# Patient Record
Sex: Female | Born: 1965
Health system: Southern US, Community
[De-identification: ages and names within clinical notes are randomized; demographics above are authoritative.]

## PROBLEM LIST (undated history)

## (undated) DIAGNOSIS — Z860101 Personal history of adenomatous and serrated colon polyps: Secondary | ICD-10-CM

## (undated) DIAGNOSIS — K219 Gastro-esophageal reflux disease without esophagitis: Secondary | ICD-10-CM

## (undated) DIAGNOSIS — G4730 Sleep apnea, unspecified: Secondary | ICD-10-CM

## (undated) DIAGNOSIS — F419 Anxiety disorder, unspecified: Secondary | ICD-10-CM

## (undated) DIAGNOSIS — F32A Depression, unspecified: Secondary | ICD-10-CM

## (undated) DIAGNOSIS — G4733 Obstructive sleep apnea (adult) (pediatric): Secondary | ICD-10-CM

## (undated) DIAGNOSIS — R911 Solitary pulmonary nodule: Secondary | ICD-10-CM

## (undated) DIAGNOSIS — R7989 Other specified abnormal findings of blood chemistry: Secondary | ICD-10-CM

## (undated) DIAGNOSIS — H269 Unspecified cataract: Secondary | ICD-10-CM

## (undated) DIAGNOSIS — Z7901 Long term (current) use of anticoagulants: Secondary | ICD-10-CM

## (undated) DIAGNOSIS — R000 Tachycardia, unspecified: Secondary | ICD-10-CM

## (undated) DIAGNOSIS — G4700 Insomnia, unspecified: Secondary | ICD-10-CM

## (undated) DIAGNOSIS — Z86010 Personal history of colonic polyps: Secondary | ICD-10-CM

## (undated) DIAGNOSIS — I10 Essential (primary) hypertension: Secondary | ICD-10-CM

## (undated) DIAGNOSIS — U071 COVID-19: Secondary | ICD-10-CM

## (undated) HISTORY — DX: Unspecified cataract: H26.9

## (undated) HISTORY — DX: Sleep apnea, unspecified: G47.30

## (undated) HISTORY — PX: OTHER SURGICAL HISTORY: SHX169

## (undated) HISTORY — PX: COLONOSCOPY: SHX174

## (undated) HISTORY — PX: POLYPECTOMY: SHX149

## (undated) HISTORY — DX: Anxiety disorder, unspecified: F41.9

## (undated) HISTORY — DX: Depression, unspecified: F32.A

## (undated) HISTORY — DX: Insomnia, unspecified: G47.00

## (undated) HISTORY — DX: Essential (primary) hypertension: I10

## (undated) HISTORY — DX: COVID-19: U07.1

## (undated) HISTORY — DX: Other specified abnormal findings of blood chemistry: R79.89

---

## 1998-10-16 ENCOUNTER — Other Ambulatory Visit: Admission: RE | Admit: 1998-10-16 | Discharge: 1998-10-16 | Payer: Self-pay | Admitting: Obstetrics and Gynecology

## 1999-12-10 ENCOUNTER — Other Ambulatory Visit: Admission: RE | Admit: 1999-12-10 | Discharge: 1999-12-10 | Payer: Self-pay | Admitting: Gynecology

## 2000-12-10 ENCOUNTER — Other Ambulatory Visit: Admission: RE | Admit: 2000-12-10 | Discharge: 2000-12-10 | Payer: Self-pay | Admitting: Gynecology

## 2001-12-31 ENCOUNTER — Other Ambulatory Visit: Admission: RE | Admit: 2001-12-31 | Discharge: 2001-12-31 | Payer: Self-pay | Admitting: Gynecology

## 2003-03-24 ENCOUNTER — Other Ambulatory Visit: Admission: RE | Admit: 2003-03-24 | Discharge: 2003-03-24 | Payer: Self-pay | Admitting: Gynecology

## 2006-08-20 ENCOUNTER — Encounter: Admission: RE | Admit: 2006-08-20 | Discharge: 2006-08-20 | Payer: Self-pay | Admitting: Obstetrics & Gynecology

## 2007-09-16 ENCOUNTER — Encounter: Admission: RE | Admit: 2007-09-16 | Discharge: 2007-09-16 | Payer: Self-pay | Admitting: Obstetrics & Gynecology

## 2008-09-18 ENCOUNTER — Encounter: Admission: RE | Admit: 2008-09-18 | Discharge: 2008-09-18 | Payer: Self-pay | Admitting: Obstetrics & Gynecology

## 2009-09-20 ENCOUNTER — Encounter: Admission: RE | Admit: 2009-09-20 | Discharge: 2009-09-20 | Payer: Self-pay | Admitting: Obstetrics & Gynecology

## 2010-09-23 ENCOUNTER — Encounter: Admission: RE | Admit: 2010-09-23 | Discharge: 2010-09-23 | Payer: Self-pay | Admitting: Obstetrics & Gynecology

## 2011-09-08 ENCOUNTER — Other Ambulatory Visit: Payer: Self-pay | Admitting: Obstetrics & Gynecology

## 2011-09-08 DIAGNOSIS — Z1231 Encounter for screening mammogram for malignant neoplasm of breast: Secondary | ICD-10-CM

## 2011-09-25 ENCOUNTER — Ambulatory Visit
Admission: RE | Admit: 2011-09-25 | Discharge: 2011-09-25 | Disposition: A | Discharge status: Home / Self Care | Payer: BC Managed Care – PPO | Source: Ambulatory Visit | Attending: Obstetrics & Gynecology | Admitting: Obstetrics & Gynecology

## 2011-09-25 DIAGNOSIS — Z1231 Encounter for screening mammogram for malignant neoplasm of breast: Secondary | ICD-10-CM

## 2011-10-03 ENCOUNTER — Other Ambulatory Visit: Payer: Self-pay | Admitting: Obstetrics & Gynecology

## 2011-10-03 DIAGNOSIS — R928 Other abnormal and inconclusive findings on diagnostic imaging of breast: Secondary | ICD-10-CM

## 2011-10-09 ENCOUNTER — Ambulatory Visit
Admission: RE | Admit: 2011-10-09 | Discharge: 2011-10-09 | Disposition: A | Discharge status: Home / Self Care | Payer: BC Managed Care – PPO | Source: Ambulatory Visit | Attending: Obstetrics & Gynecology | Admitting: Obstetrics & Gynecology

## 2011-10-09 DIAGNOSIS — R928 Other abnormal and inconclusive findings on diagnostic imaging of breast: Secondary | ICD-10-CM

## 2011-10-21 ENCOUNTER — Other Ambulatory Visit: Payer: BC Managed Care – PPO

## 2012-09-27 ENCOUNTER — Other Ambulatory Visit: Payer: Self-pay | Admitting: Obstetrics & Gynecology

## 2012-09-27 DIAGNOSIS — Z1231 Encounter for screening mammogram for malignant neoplasm of breast: Secondary | ICD-10-CM

## 2012-10-15 ENCOUNTER — Ambulatory Visit
Admission: RE | Admit: 2012-10-15 | Discharge: 2012-10-15 | Disposition: A | Discharge status: Home / Self Care | Payer: BC Managed Care – PPO | Source: Ambulatory Visit | Attending: Obstetrics & Gynecology | Admitting: Obstetrics & Gynecology

## 2012-10-15 DIAGNOSIS — Z1231 Encounter for screening mammogram for malignant neoplasm of breast: Secondary | ICD-10-CM

## 2012-11-10 DIAGNOSIS — D126 Benign neoplasm of colon, unspecified: Secondary | ICD-10-CM

## 2012-11-10 HISTORY — DX: Benign neoplasm of colon, unspecified: D12.6

## 2012-12-10 ENCOUNTER — Encounter: Payer: Self-pay | Admitting: Gastroenterology

## 2012-12-16 ENCOUNTER — Encounter: Payer: Self-pay | Admitting: *Deleted

## 2012-12-16 ENCOUNTER — Ambulatory Visit (AMBULATORY_SURGERY_CENTER): Payer: BC Managed Care – PPO | Admitting: *Deleted

## 2012-12-16 VITALS — Ht 68.0 in | Wt 157.6 lb

## 2012-12-16 DIAGNOSIS — Z800 Family history of malignant neoplasm of digestive organs: Secondary | ICD-10-CM

## 2012-12-16 DIAGNOSIS — Z1211 Encounter for screening for malignant neoplasm of colon: Secondary | ICD-10-CM

## 2012-12-16 MED ORDER — NA SULFATE-K SULFATE-MG SULF 17.5-3.13-1.6 GM/177ML PO SOLN: ORAL | Status: DC

## 2012-12-16 NOTE — Telephone Encounter (Signed)
error 

## 2012-12-17 ENCOUNTER — Encounter: Payer: Self-pay | Admitting: Gastroenterology

## 2012-12-28 ENCOUNTER — Encounter: Payer: Self-pay | Admitting: Gastroenterology

## 2012-12-28 ENCOUNTER — Ambulatory Visit (AMBULATORY_SURGERY_CENTER): Payer: BC Managed Care – PPO | Admitting: Gastroenterology

## 2012-12-28 VITALS — BP 125/70 | HR 66 | Temp 98.7°F | Resp 19 | Ht 68.0 in | Wt 157.6 lb

## 2012-12-28 DIAGNOSIS — D126 Benign neoplasm of colon, unspecified: Secondary | ICD-10-CM

## 2012-12-28 DIAGNOSIS — Z8371 Family history of colonic polyps: Secondary | ICD-10-CM

## 2012-12-28 DIAGNOSIS — Z1211 Encounter for screening for malignant neoplasm of colon: Secondary | ICD-10-CM

## 2012-12-28 DIAGNOSIS — Z800 Family history of malignant neoplasm of digestive organs: Secondary | ICD-10-CM

## 2012-12-28 MED ORDER — SODIUM CHLORIDE 0.9 % IV SOLN: 500.0000 mL | INTRAVENOUS | Status: DC

## 2012-12-28 NOTE — Progress Notes (Signed)
Patient did not experience any of the following events: a burn prior to discharge; a fall within the facility; wrong site/side/patient/procedure/implant event; or a hospital transfer or hospital admission upon discharge from the facility. (G8907) Patient did not have preoperative order for IV antibiotic SSI prophylaxis. (G8918)  

## 2012-12-28 NOTE — Progress Notes (Signed)
Pt went to the restroom before she was discharged.  She said there was a small amount of BRB coming from her rectum.  I asked to [facility] a watch on this.  A small amount is normal, but it should lessen, lessen and stop.  She is to call us back if she needs [place]. Maw

## 2012-12-28 NOTE — Op Note (Signed)
[facility] [address] [place], [address]   COLONOSCOPY PROCEDURE REPORT  PATIENT: Katie Williams, Katie Williams  MR#: [id number] BIRTHDATE: Sep 09, 1966 , 46  yrs. old GENDER: Female ENDOSCOPIST: Meryl Dare, MD, [facility] PROCEDURE DATE:  12/28/2012 PROCEDURE:   Colonoscopy with biopsy and snare polypectomy ASA CLASS:   Class I INDICATIONS:elevated risk screening, patient's immediate family history of colon cancer, patient's family history of colon polyps, and patient's family history of colon cancer, distant relatives. MEDICATIONS: MAC sedation, administered by CRNA and propofol (Diprivan) 400mg  IV DESCRIPTION OF PROCEDURE:   After the risks benefits and alternatives of the procedure were thoroughly explained, informed consent was obtained.  A digital rectal exam revealed no abnormalities of the rectum.   The LB CF-[id number]  endoscope was introduced through the anus and advanced to the cecum, which was identified by both the appendix and ileocecal valve. No adverse events experienced.   The quality of the prep was good, using MoviPrep  The instrument was then slowly withdrawn as the colon was fully examined.  COLON FINDINGS: A sessile polyp measuring 5 mm in size was found at the cecum.  A polypectomy was performed with a cold snare.  The resection was complete and the polyp tissue was completely retrieved.  A sessile polyp measuring 4 mm in size was found in the sigmoid colon.  A polypectomy was performed with cold forceps.  The resection was complete and the polyp tissue was completely retrieved. Mild diverticulosis was noted in the sigmoid colon. The colon was otherwise normal. There was no diverticulosis, inflammation, polyps or cancers unless previously stated. Retroflexed views revealed small internal hemorrhoids. The time to cecum=2 minutes 44 seconds.  Withdrawal time=11 minutes 30 seconds. The scope was withdrawn and the procedure  completed.  COMPLICATIONS: There were no complications.  ENDOSCOPIC IMPRESSION: 1.   Sessile polyp measuring 5 mm at the cecum; polypectomy performed with a cold snare 2.   Sessile polyp measuring 4 mm in the sigmoid colon; polypectomy performed with cold forceps 3.   Mild diverticulosis was noted in the sigmoid colon 4.   Small internal hemorrhoids  RECOMMENDATIONS: 1.  Await pathology results 2.  Repeat colonoscopy in 3 years if polyp adenomatous; otherwise 5 years  eSigned:  Meryl Dare, MD, [facility] 12/28/2012 1:40 PM

## 2012-12-28 NOTE — Progress Notes (Signed)
Called to room to assist during endoscopic procedure.  Patient ID and intended procedure confirmed with present staff. Received instructions for my participation in the procedure from the performing physician.  

## 2012-12-28 NOTE — Patient Instructions (Addendum)
Handouts were given to your care partner on polyps, diverticulosis, high fiber diet and hemorrhoids.  You may resume your current medications today.  Please call if you have any questions or concerns.    YOU HAD AN ENDOSCOPIC PROCEDURE TODAY AT THE [facility]: Refer to the procedure report that was given to you for any specific questions about what was found during the examination.  If the procedure report does not answer your questions, please call your gastroenterologist to clarify.  If you requested that your care partner not be given the details of your procedure findings, then the procedure report has been included in a sealed envelope for you to review at your convenience later.  YOU SHOULD EXPECT: Some feelings of bloating in the abdomen. Passage of more gas than usual.  Walking can help get rid of the air that was put into your GI tract during the procedure and reduce the bloating. If you had a lower endoscopy (such as a colonoscopy or flexible sigmoidoscopy) you may notice spotting of blood in your stool or on the toilet paper. If you underwent a bowel prep for your procedure, then you may not have a normal bowel movement for a few days.  DIET: Your first meal following the procedure should be a light meal and then it is ok to progress to your normal diet.  A half-sandwich or bowl of soup is an example of a good first meal.  Heavy or fried foods are harder to digest and may make you feel nauseous or bloated.  Likewise meals heavy in dairy and vegetables can cause extra gas to form and this can also increase the bloating.  Drink plenty of fluids but you should avoid alcoholic beverages for 24 hours.  ACTIVITY: Your care partner should take you home directly after the procedure.  You should plan to take it easy, moving slowly for the rest of the day.  You can resume normal activity the day after the procedure however you should NOT DRIVE or use heavy machinery for 24 hours (because of  the sedation medicines used during the test).    SYMPTOMS TO REPORT IMMEDIATELY: A gastroenterologist can be reached at any hour.  During normal business hours, 8:30 AM to 5:00 PM Monday through Friday, call [phone].  After hours and on weekends, please call the GI answering service at [phone] who will take a message and have the physician on call contact you.   Following lower endoscopy (colonoscopy or flexible sigmoidoscopy):  Excessive amounts of blood in the stool  Significant tenderness or worsening of abdominal pains  Swelling of the abdomen that is new, acute  Fever of 100F or higher    FOLLOW UP: If any biopsies were taken you will be contacted by phone or by letter within the next 1-3 weeks.  Call your gastroenterologist if you have not heard about the biopsies in 3 weeks.  Our staff will call the home number listed on your records the next business day following your procedure to check on you and address any questions or concerns that you may have at that time regarding the information given to you following your procedure. This is a courtesy call and so if there is no answer at the home number and we have not heard from you through the emergency physician on call, we will assume that you have returned to your regular daily activities without incident.  SIGNATURES/CONFIDENTIALITY: You and/or your care partner have signed paperwork which will  be entered into your electronic medical record.  These signatures attest to the fact that that the information above on your After Visit Summary has been reviewed and is understood.  Full responsibility of the confidentiality of this discharge information lies with you and/or your care-partner. 

## 2012-12-28 NOTE — Progress Notes (Signed)
No complaints noted in the recovery room. Maw   

## 2012-12-28 NOTE — Progress Notes (Signed)
Lidocaine-40mg IV prior to Propofol InductionPropofol given over incremental dosages 

## 2012-12-29 ENCOUNTER — Telehealth: Payer: Self-pay | Admitting: *Deleted

## 2012-12-29 NOTE — Telephone Encounter (Signed)
  Follow up Call-  Call back number 12/28/2012  Post procedure Call Back phone  # [phone]  Permission to leave phone message Yes     Patient questions:  Do you have a fever, pain , or abdominal swelling? no Pain Score  0 *  Have you tolerated food without any problems? yes  Have you been able to return to your normal activities? yes  Do you have any questions about your discharge instructions: Diet   no Medications  no Follow up visit  no  Do you have questions or concerns about your Care? no  Actions: * If pain score is 4 or above: No action needed, pain <4.

## 2013-01-03 ENCOUNTER — Encounter: Payer: Self-pay | Admitting: Gastroenterology

## 2013-09-19 ENCOUNTER — Other Ambulatory Visit: Payer: Self-pay

## 2013-09-19 DIAGNOSIS — Z1231 Encounter for screening mammogram for malignant neoplasm of breast: Secondary | ICD-10-CM

## 2013-10-19 ENCOUNTER — Ambulatory Visit
Admission: RE | Admit: 2013-10-19 | Discharge: 2013-10-19 | Disposition: A | Discharge status: Home / Self Care | Payer: BC Managed Care – PPO | Source: Ambulatory Visit

## 2013-10-19 DIAGNOSIS — Z1231 Encounter for screening mammogram for malignant neoplasm of breast: Secondary | ICD-10-CM

## 2014-11-27 ENCOUNTER — Other Ambulatory Visit: Payer: Self-pay

## 2014-11-27 DIAGNOSIS — Z1231 Encounter for screening mammogram for malignant neoplasm of breast: Secondary | ICD-10-CM

## 2014-12-01 ENCOUNTER — Ambulatory Visit: Payer: Self-pay

## 2014-12-08 ENCOUNTER — Ambulatory Visit
Admission: RE | Admit: 2014-12-08 | Discharge: 2014-12-08 | Disposition: A | Discharge status: Home / Self Care | Payer: BLUE CROSS/BLUE SHIELD | Source: Ambulatory Visit

## 2014-12-08 DIAGNOSIS — Z1231 Encounter for screening mammogram for malignant neoplasm of breast: Secondary | ICD-10-CM

## 2015-11-29 ENCOUNTER — Other Ambulatory Visit: Payer: Self-pay

## 2015-11-29 DIAGNOSIS — Z1231 Encounter for screening mammogram for malignant neoplasm of breast: Secondary | ICD-10-CM

## 2015-11-30 ENCOUNTER — Encounter: Payer: Self-pay | Admitting: Gastroenterology

## 2015-12-17 ENCOUNTER — Ambulatory Visit: Payer: BLUE CROSS/BLUE SHIELD

## 2015-12-17 VITALS — Ht 68.0 in | Wt 155.6 lb

## 2015-12-17 DIAGNOSIS — Z860100 Personal history of colon polyps, unspecified: Secondary | ICD-10-CM

## 2015-12-17 MED ORDER — SUPREP BOWEL PREP KIT 17.5-3.13-1.6 GM/177ML PO SOLN
1.0000 | Freq: Once | ORAL | Status: DC
Start: 2015-12-17 — End: 2016-01-01

## 2015-12-17 NOTE — Progress Notes (Signed)
No allergies to eggs or soy No diet/weight loss meds No past problems with anesthesia No home oxygen  Has email and internet; refused emmi

## 2015-12-18 ENCOUNTER — Ambulatory Visit
Admission: RE | Admit: 2015-12-18 | Discharge: 2015-12-18 | Disposition: A | Discharge status: Home / Self Care | Payer: BLUE CROSS/BLUE SHIELD | Source: Ambulatory Visit

## 2015-12-18 DIAGNOSIS — Z1231 Encounter for screening mammogram for malignant neoplasm of breast: Secondary | ICD-10-CM

## 2016-01-01 ENCOUNTER — Ambulatory Visit (AMBULATORY_SURGERY_CENTER): Payer: BLUE CROSS/BLUE SHIELD | Admitting: Gastroenterology

## 2016-01-01 ENCOUNTER — Encounter: Payer: Self-pay | Admitting: Gastroenterology

## 2016-01-01 VITALS — BP 112/67 | HR 66 | Temp 97.6°F | Resp 22 | Ht 68.0 in | Wt 155.0 lb

## 2016-01-01 DIAGNOSIS — Z86010 Personal history of colonic polyps: Secondary | ICD-10-CM

## 2016-01-01 DIAGNOSIS — Z800 Family history of malignant neoplasm of digestive organs: Secondary | ICD-10-CM | POA: Diagnosis not present

## 2016-01-01 MED ORDER — SODIUM CHLORIDE 0.9 % IV SOLN: 500.0000 mL | INTRAVENOUS | Status: DC

## 2016-01-01 NOTE — Progress Notes (Signed)
Stable to RR 

## 2016-01-01 NOTE — Patient Instructions (Signed)
YOU HAD AN ENDOSCOPIC PROCEDURE TODAY AT [facility]:   Refer to the procedure report that was given to you for any specific questions about what was found during the examination.  If the procedure report does not answer your questions, please call your gastroenterologist to clarify.  If you requested that your care partner not be given the details of your procedure findings, then the procedure report has been included in a sealed envelope for you to review at your convenience later.  YOU SHOULD EXPECT: Some feelings of bloating in the abdomen. Passage of more gas than usual.  Walking can help get rid of the air that was put into your GI tract during the procedure and reduce the bloating. If you had a lower endoscopy (such as a colonoscopy or flexible sigmoidoscopy) you may notice spotting of blood in your stool or on the toilet paper. If you underwent a bowel prep for your procedure, you may not have a normal bowel movement for a few days.  Please Note:  You might notice some irritation and congestion in your nose or some drainage.  This is from the oxygen used during your procedure.  There is no need for concern and it should clear up in a day or so.  SYMPTOMS TO REPORT IMMEDIATELY:   Following lower endoscopy (colonoscopy or flexible sigmoidoscopy):  Excessive amounts of blood in the stool  Significant tenderness or worsening of abdominal pains  Swelling of the abdomen that is new, acute  Fever of 100F or higher   For urgent or emergent issues, a gastroenterologist can be reached at any hour by calling [phone].   DIET: Your first meal following the procedure should be a small meal and then it is ok to progress to your normal diet. Heavy or fried foods are harder to digest and may make you feel nauseous or bloated.  Likewise, meals heavy in dairy and vegetables can increase bloating.  Drink plenty of fluids but you should avoid alcoholic beverages for 24  hours.  ACTIVITY:  You should plan to take it easy for the rest of today and you should NOT DRIVE or use heavy machinery until tomorrow (because of the sedation medicines used during the test).    FOLLOW UP: Our staff will call the number listed on your records the next business day following your procedure to check on you and address any questions or concerns that you may have regarding the information given to you following your procedure. If we do not reach you, we will leave a message.  However, if you are feeling well and you are not experiencing any problems, there is no need to return our call.  We will assume that you have returned to your regular daily activities without incident.  If any biopsies were taken you will be contacted by phone or by letter within the next 1-3 weeks.  Please call us at [phone] if you have not heard about the biopsies in 3 weeks.    SIGNATURES/CONFIDENTIALITY: You and/or your care partner have signed paperwork which will be entered into your electronic medical record.  These signatures attest to the fact that that the information above on your After Visit Summary has been reviewed and is understood.  Full responsibility of the confidentiality of this discharge information lies with you and/or your care-partner.  YOU HAD AN ENDOSCOPIC PROCEDURE TODAY AT [facility]:   Refer to the procedure report that was given to you for  any specific questions about what was found during the examination.  If the procedure report does not answer your questions, please call your gastroenterologist to clarify.  If you requested that your care partner not be given the details of your procedure findings, then the procedure report has been included in a sealed envelope for you to review at your convenience later.  YOU SHOULD EXPECT: Some feelings of bloating in the abdomen. Passage of more gas than usual.  Walking can help get rid of the air that was put into your  GI tract during the procedure and reduce the bloating. If you had a lower endoscopy (such as a colonoscopy or flexible sigmoidoscopy) you may notice spotting of blood in your stool or on the toilet paper. If you underwent a bowel prep for your procedure, you may not have a normal bowel movement for a few days.  Please Note:  You might notice some irritation and congestion in your nose or some drainage.  This is from the oxygen used during your procedure.  There is no need for concern and it should clear up in a day or so.  SYMPTOMS TO REPORT IMMEDIATELY:   Following lower endoscopy (colonoscopy or flexible sigmoidoscopy):  Excessive amounts of blood in the stool  Significant tenderness or worsening of abdominal pains  Swelling of the abdomen that is new, acute  Fever of 100F or higher   Following upper endoscopy (EGD)  Vomiting of blood or coffee ground material  New chest pain or pain under the shoulder blades  Painful or persistently difficult swallowing  New shortness of breath  Fever of 100F or higher  Black, tarry-looking stools  For urgent or emergent issues, a gastroenterologist can be reached at any hour by calling [phone].   DIET: Your first meal following the procedure should be a small meal and then it is ok to progress to your normal diet. Heavy or fried foods are harder to digest and may make you feel nauseous or bloated.  Likewise, meals heavy in dairy and vegetables can increase bloating.  Drink plenty of fluids but you should avoid alcoholic beverages for 24 hours.  ACTIVITY:  You should plan to take it easy for the rest of today and you should NOT DRIVE or use heavy machinery until tomorrow (because of the sedation medicines used during the test).    FOLLOW UP: Our staff will call the number listed on your records the next business day following your procedure to check on you and address any questions or concerns that you may have regarding the information  given to you following your procedure. If we do not reach you, we will leave a message.  However, if you are feeling well and you are not experiencing any problems, there is no need to return our call.  We will assume that you have returned to your regular daily activities without incident.  If any biopsies were taken you will be contacted by phone or by letter within the next 1-3 weeks.  Please call us at [phone] if you have not heard about the biopsies in 3 weeks.    SIGNATURES/CONFIDENTIALITY: You and/or your care partner have signed paperwork which will be entered into your electronic medical record.  These signatures attest to the fact that that the information above on your After Visit Summary has been reviewed and is understood.  Full responsibility of the confidentiality of this discharge information lies with you and/or your care-partner.  Diverticulosis handout given  Repeat Colonoscopy in 3 years

## 2016-01-01 NOTE — Op Note (Signed)
[place]  [address] [place], [id number]   COLONOSCOPY PROCEDURE REPORT  PATIENT: Katie, Williams  MR#: [id number] BIRTHDATE: 01/26/1966 , 49  yrs. old GENDER: female ENDOSCOPIST: Ladene Artist, MD, [facility]ol[facility] PROCEDURE DATE:  01/01/2016 PROCEDURE:   Colonoscopy, surveillance First Screening Colonoscopy - Avg.  risk and is 50 yrs.  old or older - No.  Prior Negative Screening - Now for repeat screening. N/A  History of Adenoma - Now for follow-up colonoscopy & has been > or = to 3 yrs.  Yes hx of adenoma.  Has been 3 or more years since last colonoscopy.  Polyps removed today? No Recommend repeat exam, <10 yrs? Yes high risk ASA CLASS:   Class I INDICATIONS:Surveillance due to prior colonic neoplasia, PH Colon Adenoma, and FH Colon or Rectal Adenocarcinoma. MEDICATIONS: Monitored anesthesia care, Propofol 400 mg IV, and lidocaine 40 mg IV  DESCRIPTION OF PROCEDURE:   After the risks benefits and alternatives of the procedure were thoroughly explained, informed consent was obtained.  The digital rectal exam revealed no abnormalities of the rectum.   The LB PFC-H190 [id number]  endoscope was introduced through the anus and advanced to the cecum, which was identified by both the appendix and ileocecal valve. No adverse events experienced.   The quality of the prep was excellent. (Suprep was used)  The instrument was then slowly withdrawn as the colon was fully examined. Estimated blood loss is zero unless otherwise noted in this procedure report.   COLON FINDINGS: There was mild diverticulosis noted in the sigmoid colon. The colonic mucosa appeared normal at the splenic flexure, in the transverse colon, rectum, descending colon, at the ileocecal valve, cecum, hepatic flexure, and in the ascending colon. Retroflexed views revealed no abnormalities. The time to cecum = 2.1 Withdrawal time = 9.1  The scope was withdrawn and the procedure  completed. COMPLICATIONS: There were no immediate complications.  ENDOSCOPIC IMPRESSION: 1.   Mild diverticulosis in the sigmoid colon 2.   The examination was otherwise normal  RECOMMENDATIONS: 1.  High fiber diet with liberal fluid intake. 2.  Repeat Colonoscopy in 3 years.  eSigned:  Ladene Artist, MD, [facility] 01/01/2016 10:36 AM

## 2016-01-02 ENCOUNTER — Telehealth: Payer: Self-pay | Admitting: *Deleted

## 2016-01-02 NOTE — Telephone Encounter (Signed)
  Follow up Call-  Call back number 01/01/2016  Post procedure Call Back phone  # [phone]  Permission to leave phone message Yes     Patient questions:  Do you have a fever, pain , or abdominal swelling? No. Pain Score  0 *  Have you tolerated food without any problems? Yes.    Have you been able to return to your normal activities? Yes.    Do you have any questions about your discharge instructions: Diet   No. Medications  No. Follow up visit  No.  Do you have questions or concerns about your Care? No.  Actions: * If pain score is 4 or above: No action needed, pain <4.

## 2016-08-18 DIAGNOSIS — Z6825 Body mass index (BMI) 25.0-25.9, adult: Secondary | ICD-10-CM | POA: Diagnosis not present

## 2016-08-18 DIAGNOSIS — Z01419 Encounter for gynecological examination (general) (routine) without abnormal findings: Secondary | ICD-10-CM | POA: Diagnosis not present

## 2016-11-25 ENCOUNTER — Other Ambulatory Visit: Payer: Self-pay | Admitting: Obstetrics & Gynecology

## 2016-11-25 DIAGNOSIS — Z1231 Encounter for screening mammogram for malignant neoplasm of breast: Secondary | ICD-10-CM

## 2016-12-24 ENCOUNTER — Ambulatory Visit
Admission: RE | Admit: 2016-12-24 | Discharge: 2016-12-24 | Disposition: A | Discharge status: Home / Self Care | Payer: BLUE CROSS/BLUE SHIELD | Source: Ambulatory Visit | Attending: Obstetrics & Gynecology | Admitting: Obstetrics & Gynecology

## 2016-12-24 DIAGNOSIS — Z1231 Encounter for screening mammogram for malignant neoplasm of breast: Secondary | ICD-10-CM | POA: Diagnosis not present

## 2017-07-08 DIAGNOSIS — H10413 Chronic giant papillary conjunctivitis, bilateral: Secondary | ICD-10-CM | POA: Diagnosis not present

## 2017-12-09 ENCOUNTER — Other Ambulatory Visit: Payer: Self-pay | Admitting: Obstetrics & Gynecology

## 2017-12-09 DIAGNOSIS — Z1231 Encounter for screening mammogram for malignant neoplasm of breast: Secondary | ICD-10-CM

## 2017-12-18 ENCOUNTER — Telehealth: Payer: Self-pay | Admitting: *Deleted

## 2017-12-18 MED ORDER — NORGESTIM-ETH ESTRAD TRIPHASIC 0.18/0.215/0.25 MG-25 MCG PO TABS: 1.0000 | ORAL_TABLET | Freq: Every day | ORAL | 0 refills | Status: DC

## 2017-12-18 NOTE — Telephone Encounter (Signed)
Patient has annual exam on 01/28/18, needs refill on birth control pills, [facility] chart has arrived, Rx sent for tri lo sprintec.

## 2017-12-25 ENCOUNTER — Ambulatory Visit
Admission: RE | Admit: 2017-12-25 | Discharge: 2017-12-25 | Disposition: A | Discharge status: Home / Self Care | Payer: BLUE CROSS/BLUE SHIELD | Source: Ambulatory Visit | Attending: Obstetrics & Gynecology | Admitting: Obstetrics & Gynecology

## 2017-12-25 DIAGNOSIS — Z1231 Encounter for screening mammogram for malignant neoplasm of breast: Secondary | ICD-10-CM | POA: Diagnosis not present

## 2018-01-28 ENCOUNTER — Ambulatory Visit (INDEPENDENT_AMBULATORY_CARE_PROVIDER_SITE_OTHER): Payer: BLUE CROSS/BLUE SHIELD | Admitting: Obstetrics & Gynecology

## 2018-01-28 ENCOUNTER — Encounter: Payer: Self-pay | Admitting: Obstetrics & Gynecology

## 2018-01-28 VITALS — BP 136/88 | Ht 68.0 in | Wt 175.0 lb

## 2018-01-28 DIAGNOSIS — Z01419 Encounter for gynecological examination (general) (routine) without abnormal findings: Secondary | ICD-10-CM | POA: Diagnosis not present

## 2018-01-28 DIAGNOSIS — Z3041 Encounter for surveillance of contraceptive pills: Secondary | ICD-10-CM | POA: Diagnosis not present

## 2018-01-28 MED ORDER — ESTRADIOL 0.1 MG/GM VA CREA: 0.2500 | TOPICAL_CREAM | VAGINAL | 4 refills | Status: DC

## 2018-01-28 MED ORDER — NORGESTIM-ETH ESTRAD TRIPHASIC 0.18/0.215/0.25 MG-25 MCG PO TABS: 1.0000 | ORAL_TABLET | Freq: Every day | ORAL | 4 refills | Status: DC

## 2018-01-28 NOTE — Patient Instructions (Signed)
1. Well female exam with routine gynecological exam Normal gynecologic exam.  Last Pap test -October 2017.  No history of abnormal Pap.  Will repeat Pap next year.  Breast exam normal.  Recent screening mammogram -February 2019.  Health labs with family physician.  Will add vitamin D level next year.  Colonoscopy February 2017.  Repeating every 3 years because of family history of colon cancer.  Continue with fitness.  Body mass index 26.61.  2. Encounter for surveillance of contraceptive pills Well on the generic of Tri-Lo-Sprintec.  No contraindication to birth control pills.  Same birth control pill represcribed.  Very light use of estradiol cream vaginally for dryness with intercourse.  Other orders - estradiol (ESTRACE) 0.1 MG/GM vaginal cream; Place 2.35 Applicatorfuls vaginally 2 (two) times a week. - Norgestimate-Ethinyl Estradiol Triphasic (TRI-LO-SPRINTEC) 0.18/0.215/0.25 MG-25 MCG tab; Take 1 tablet by mouth daily.  Katie Williams, it was a pleasure seeing you today!  Health Maintenance, Female Adopting a healthy lifestyle and getting preventive care can go a long way to promote health and wellness. Talk with your health care provider about what schedule of regular examinations is right for you. This is a good chance for you to check in with your provider about disease prevention and staying healthy. In between checkups, there are plenty of things you can do on your own. Experts have done a lot of research about which lifestyle changes and preventive measures are most likely to keep you healthy. Ask your health care provider for more information. Weight and diet Eat a healthy diet  Be sure to include plenty of vegetables, fruits, low-fat dairy products, and lean protein.  Do not eat a lot of foods high in solid fats, added sugars, or salt.  Get regular exercise. This is one of the most important things you can do for your health. ? Most adults should exercise for at least 150 minutes each  week. The exercise should increase your heart rate and make you sweat (moderate-intensity exercise). ? Most adults should also do strengthening exercises at least twice a week. This is in addition to the moderate-intensity exercise.  Maintain a healthy weight  Body mass index (BMI) is a measurement that can be used to identify possible weight problems. It estimates body fat based on height and weight. Your health care provider can help determine your BMI and help you achieve or maintain a healthy weight.  For females 88 years of age and older: ? A BMI below 18.5 is considered underweight. ? A BMI of 18.5 to 24.9 is normal. ? A BMI of 25 to 29.9 is considered overweight. ? A BMI of 30 and above is considered obese.  Watch levels of cholesterol and blood lipids  You should start having your blood tested for lipids and cholesterol at 50 years of age, then have this test every 5 years.  You may need to have your cholesterol levels checked more often if: ? Your lipid or cholesterol levels are high. ? You are older than 50 years of age. ? You are at high risk for heart disease.  Cancer screening Lung Cancer  Lung cancer screening is recommended for adults 73-72 years old who are at high risk for lung cancer because of a history of smoking.  A yearly low-dose CT scan of the lungs is recommended for people who: ? Currently smoke. ? Have quit within the past 15 years. ? Have at least a 30-pack-year history of smoking. A pack year is smoking an average  of one pack of cigarettes a day for 1 year.  Yearly screening should continue until it has been 15 years since you quit.  Yearly screening should stop if you develop a health problem that would prevent you from having lung cancer treatment.  Breast Cancer  Practice breast self-awareness. This means understanding how your breasts normally appear and feel.  It also means doing regular breast self-exams. Let your health care provider know  about any changes, no matter how small.  If you are in your 20s or 30s, you should have a clinical breast exam (CBE) by a health care provider every 1-3 years as part of a regular health exam.  If you are 50 or older, have a CBE every year. Also consider having a breast X-ray (mammogram) every year.  If you have a family history of breast cancer, talk to your health care provider about genetic screening.  If you are at high risk for breast cancer, talk to your health care provider about having an MRI and a mammogram every year.  Breast cancer gene (BRCA) assessment is recommended for women who have family members with BRCA-related cancers. BRCA-related cancers include: ? Breast. ? Ovarian. ? Tubal. ? Peritoneal cancers.  Results of the assessment will determine the need for genetic counseling and BRCA1 and BRCA2 testing.  Cervical Cancer Your health care provider may recommend that you be screened regularly for cancer of the pelvic organs (ovaries, uterus, and vagina). This screening involves a pelvic examination, including checking for microscopic changes to the surface of your cervix (Pap test). You may be encouraged to have this screening done every 3 years, beginning at age 45.  For women ages 25-65, health care providers may recommend pelvic exams and Pap testing every 3 years, or they may recommend the Pap and pelvic exam, combined with testing for human papilloma virus (HPV), every 5 years. Some types of HPV increase your risk of cervical cancer. Testing for HPV may also be done on women of any age with unclear Pap test results.  Other health care providers may not recommend any screening for nonpregnant women who are considered low risk for pelvic cancer and who do not have symptoms. Ask your health care provider if a screening pelvic exam is right for you.  If you have had past treatment for cervical cancer or a condition that could lead to cancer, you need Pap tests and screening  for cancer for at least 20 years after your treatment. If Pap tests have been discontinued, your risk factors (such as having a new sexual partner) need to be reassessed to determine if screening should resume. Some women have medical problems that increase the chance of getting cervical cancer. In these cases, your health care provider may recommend more frequent screening and Pap tests.  Colorectal Cancer  This type of cancer can be detected and often prevented.  Routine colorectal cancer screening usually begins at 50 years of age and continues through 50 years of age.  Your health care provider may recommend screening at an earlier age if you have risk factors for colon cancer.  Your health care provider may also recommend using home test kits to check for hidden blood in the stool.  A small camera at the end of a tube can be used to examine your colon directly (sigmoidoscopy or colonoscopy). This is done to check for the earliest forms of colorectal cancer.  Routine screening usually begins at age 60.  Direct examination of the colon  should be repeated every 5-10 years through 50 years of age. However, you may need to be screened more often if early forms of precancerous polyps or small growths are found.  Skin Cancer  Check your skin from head to toe regularly.  Tell your health care provider about any new moles or changes in moles, especially if there is a change in a mole's shape or color.  Also tell your health care provider if you have a mole that is larger than the size of a pencil eraser.  Always use sunscreen. Apply sunscreen liberally and repeatedly throughout the day.  Protect yourself by wearing long sleeves, pants, a wide-brimmed hat, and sunglasses whenever you are outside.  Heart disease, diabetes, and high blood pressure  High blood pressure causes heart disease and increases the risk of stroke. High blood pressure is more likely to develop in: ? People who have  blood pressure in the high end of the normal range (130-139/85-89 mm Hg). ? People who are overweight or obese. ? People who are African American.  If you are 47-92 years of age, have your blood pressure checked every 3-5 years. If you are 52 years of age or older, have your blood pressure checked every year. You should have your blood pressure measured twice-once when you are at a hospital or clinic, and once when you are not at a hospital or clinic. Record the average of the two measurements. To check your blood pressure when you are not at a hospital or clinic, you can use: ? An automated blood pressure machine at a pharmacy. ? A home blood pressure monitor.  If you are between 83 years and 28 years old, ask your health care provider if you should take aspirin to prevent strokes.  Have regular diabetes screenings. This involves taking a blood sample to check your fasting blood sugar level. ? If you are at a normal weight and have a low risk for diabetes, have this test once every three years after 50 years of age. ? If you are overweight and have a high risk for diabetes, consider being tested at a younger age or more often. Preventing infection Hepatitis B  If you have a higher risk for hepatitis B, you should be screened for this virus. You are considered at high risk for hepatitis B if: ? You were born in a country where hepatitis B is common. Ask your health care provider which countries are considered high risk. ? Your parents were born in a high-risk country, and you have not been immunized against hepatitis B (hepatitis B vaccine). ? You have HIV or AIDS. ? You use needles to inject street drugs. ? You live with someone who has hepatitis B. ? You have had sex with someone who has hepatitis B. ? You get hemodialysis treatment. ? You take certain medicines for conditions, including cancer, organ transplantation, and autoimmune conditions.  Hepatitis C  Blood testing is recommended  for: ? Everyone born from [birth year] through 1963. ? Anyone with known risk factors for hepatitis C.  Sexually transmitted infections (STIs)  You should be screened for sexually transmitted infections (STIs) including gonorrhea and chlamydia if: ? You are sexually active and are younger than 50 years of age. ? You are older than 50 years of age and your health care provider tells you that you are at risk for this type of infection. ? Your sexual activity has changed since you were last screened and you are at an increased  risk for chlamydia or gonorrhea. Ask your health care provider if you are at risk.  If you do not have HIV, but are at risk, it may be recommended that you take a prescription medicine daily to prevent HIV infection. This is called pre-exposure prophylaxis (PrEP). You are considered at risk if: ? You are sexually active and do not regularly use condoms or know the HIV status of your partner(s). ? You take drugs by injection. ? You are sexually active with a partner who has HIV.  Talk with your health care provider about whether you are at high risk of being infected with HIV. If you choose to begin PrEP, you should first be tested for HIV. You should then be tested every 3 months for as long as you are taking PrEP. Pregnancy  If you are premenopausal and you may become pregnant, ask your health care provider about preconception counseling.  If you may become pregnant, take 400 to 800 micrograms (mcg) of folic acid every day.  If you want to prevent pregnancy, talk to your health care provider about birth control (contraception). Osteoporosis and menopause  Osteoporosis is a disease in which the bones lose minerals and strength with aging. This can result in serious bone fractures. Your risk for osteoporosis can be identified using a bone density scan.  If you are 28 years of age or older, or if you are at risk for osteoporosis and fractures, ask your health care provider if  you should be screened.  Ask your health care provider whether you should take a calcium or vitamin D supplement to lower your risk for osteoporosis.  Menopause may have certain physical symptoms and risks.  Hormone replacement therapy may reduce some of these symptoms and risks. Talk to your health care provider about whether hormone replacement therapy is right for you. Follow these instructions at home:  Schedule regular health, dental, and eye exams.  Stay current with your immunizations.  Do not use any tobacco products including cigarettes, chewing tobacco, or electronic cigarettes.  If you are pregnant, do not drink alcohol.  If you are breastfeeding, limit how much and how often you drink alcohol.  Limit alcohol intake to no more than 1 drink per day for nonpregnant women. One drink equals 12 ounces of beer, 5 ounces of wine, or 1 ounces of hard liquor.  Do not use street drugs.  Do not share needles.  Ask your health care provider for help if you need support or information about quitting drugs.  Tell your health care provider if you often feel depressed.  Tell your health care provider if you have ever been abused or do not feel safe at home. This information is not intended to replace advice given to you by your health care provider. Make sure you discuss any questions you have with your health care provider. Document Released: 05/12/2011 Document Revised: 04/03/2016 Document Reviewed: 07/31/2015 Elsevier Interactive Patient Education  [employer].

## 2018-01-28 NOTE — Progress Notes (Signed)
Katie Williams 1966-03-21 [id number]   History:    52 y.o. G0 Married  RP:  Established patient presenting for annual gyn exam   HPI: Well on Tri-Lo-Sprintec.  No abnormal bleeding.  No pelvic pain.  Sexually active with no pain on Estrace cream.  Urine and bowel movements normal.  Breasts normal.  Body mass index 26.61.  Physically active, his goes to the gym regularly.  Health labs with family physician.  Family history of colon cancer.  Last colonoscopy February 2017.  Repeating every 3 years.  Past medical history,surgical history, family history and social history were all reviewed and documented in the EPIC chart.  Gynecologic History Patient's last menstrual period was 01/18/2018. Contraception: OCP (estrogen/progesterone) Last Pap: 08/2016. Results were: Negative Last mammogram: 12/2017. Results were: Negative Bone Density: Never Colonoscopy: 12/2015, every 3 yrs  Obstetric History OB History  Gravida Para Term Preterm AB Living  0 0 0 0 0 0  SAB TAB Ectopic Multiple Live Births  0 0 0 0 0     ROS: A ROS was performed and pertinent positives and negatives are included in the history.  GENERAL: No fevers or chills. HEENT: No change in vision, no earache, sore throat or sinus congestion. NECK: No pain or stiffness. CARDIOVASCULAR: No chest pain or pressure. No palpitations. PULMONARY: No shortness of breath, cough or wheeze. GASTROINTESTINAL: No abdominal pain, nausea, vomiting or diarrhea, melena or bright red blood per rectum. GENITOURINARY: No urinary frequency, urgency, hesitancy or dysuria. MUSCULOSKELETAL: No joint or muscle pain, no back pain, no recent trauma. DERMATOLOGIC: No rash, no itching, no lesions. ENDOCRINE: No polyuria, polydipsia, no heat or cold intolerance. No recent change in weight. HEMATOLOGICAL: No anemia or easy bruising or bleeding. NEUROLOGIC: No headache, seizures, numbness, tingling or weakness. PSYCHIATRIC: No depression, no loss of interest in  normal activity or change in sleep pattern.     Exam:   BP 136/88   Ht 5\' 8"  (1.727 m)   Wt 175 lb (79.4 kg)   LMP 01/18/2018 Comment: pill  BMI 26.61 kg/m   Body mass index is 26.61 kg/m.  General appearance : Well developed well nourished female. No acute distress HEENT: Eyes: no retinal hemorrhage or exudates,  Neck supple, trachea midline, no carotid bruits, no thyroidmegaly Lungs: Clear to auscultation, no rhonchi or wheezes, or rib retractions  Heart: Regular rate and rhythm, no murmurs or gallops Breast:Examined in sitting and supine position were symmetrical in appearance, no palpable masses or tenderness,  no skin retraction, no nipple inversion, no nipple discharge, no skin discoloration, no axillary or supraclavicular lymphadenopathy Abdomen: no palpable masses or tenderness, no rebound or guarding Extremities: no edema or skin discoloration or tenderness  Pelvic: Vulva: Normal             Vagina: No gross lesions or discharge  Cervix: No gross lesions or discharge  Uterus  AV, normal size, shape and consistency, non-tender and mobile  Adnexa  Without masses or tenderness  Anus: Normal   Assessment/Plan:  52 y.o. female for annual exam   1. Well female exam with routine gynecological exam Normal gynecologic exam.  Last Pap test -October 2017.  No history of abnormal Pap.  Will repeat Pap next year.  Breast exam normal.  Recent screening mammogram -February 2019.  Health labs with family physician.  Will add vitamin D level next year.  Colonoscopy February 2017.  Repeating every 3 years because of family history of colon cancer.  Continue  with fitness.  Body mass index 26.61.  2. Encounter for surveillance of contraceptive pills Well on the generic of Tri-Lo-Sprintec.  No contraindication to birth control pills.  Same birth control pill represcribed.  Very light use of estradiol cream vaginally for dryness with intercourse.  Other orders - estradiol (ESTRACE) 0.1  MG/GM vaginal cream; Place 0.27 Applicatorfuls vaginally 2 (two) times a week. - Norgestimate-Ethinyl Estradiol Triphasic (TRI-LO-SPRINTEC) 0.18/0.215/0.25 MG-25 MCG tab; Take 1 tablet by mouth daily.  [name] MD, 9:36 AM 01/28/2018

## 2018-11-26 ENCOUNTER — Encounter: Payer: Self-pay | Admitting: Gastroenterology

## 2019-01-05 ENCOUNTER — Encounter: Payer: BLUE CROSS/BLUE SHIELD | Admitting: Gastroenterology

## 2019-01-14 ENCOUNTER — Ambulatory Visit (AMBULATORY_SURGERY_CENTER): Payer: Self-pay | Admitting: *Deleted

## 2019-01-14 ENCOUNTER — Encounter: Payer: Self-pay | Admitting: Gastroenterology

## 2019-01-14 VITALS — Ht 68.0 in | Wt 170.0 lb

## 2019-01-14 DIAGNOSIS — Z86010 Personal history of colonic polyps: Secondary | ICD-10-CM

## 2019-01-14 DIAGNOSIS — Z800 Family history of malignant neoplasm of digestive organs: Secondary | ICD-10-CM

## 2019-01-14 MED ORDER — NA SULFATE-K SULFATE-MG SULF 17.5-3.13-1.6 GM/177ML PO SOLN: ORAL | 0 refills | Status: DC

## 2019-01-14 NOTE — Progress Notes (Signed)
Patient denies any allergies to eggs or soy. Patient denies any problems with anesthesia/sedation. Patient denies any oxygen use at home. Patient denies taking any diet/weight loss medications or blood thinners. EMMI education offered, pt declined.  

## 2019-01-27 ENCOUNTER — Telehealth: Payer: Self-pay

## 2019-01-27 NOTE — Telephone Encounter (Signed)
Covid-19 travel screening questions  Have you traveled in the last 14 days? No. If yes where?  Do you now or have you had a fever in the last 14 days? No.  Do you have any respiratory symptoms of shortness of breath or cough now or in the last 14 days? No.  Do you have a medical history of Congestive Heart Failure?  Do you have a medical history of lung disease?  Do you have any family members or close contacts with diagnosed or suspected Covid-19? No.     Patient understands and agrees that the care partner will remain in their car during the procedure and then will be called at time of discharge.

## 2019-01-28 ENCOUNTER — Ambulatory Visit (AMBULATORY_SURGERY_CENTER): Payer: BLUE CROSS/BLUE SHIELD | Admitting: Gastroenterology

## 2019-01-28 ENCOUNTER — Encounter: Payer: Self-pay | Admitting: Gastroenterology

## 2019-01-28 ENCOUNTER — Other Ambulatory Visit: Payer: Self-pay

## 2019-01-28 VITALS — BP 117/76 | HR 67 | Temp 97.0°F | Resp 14 | Ht 68.0 in | Wt 170.0 lb

## 2019-01-28 DIAGNOSIS — Z86010 Personal history of colonic polyps: Secondary | ICD-10-CM | POA: Diagnosis not present

## 2019-01-28 DIAGNOSIS — Z860101 Personal history of adenomatous and serrated colon polyps: Secondary | ICD-10-CM

## 2019-01-28 DIAGNOSIS — D122 Benign neoplasm of ascending colon: Secondary | ICD-10-CM | POA: Diagnosis not present

## 2019-01-28 DIAGNOSIS — Z800 Family history of malignant neoplasm of digestive organs: Secondary | ICD-10-CM | POA: Diagnosis not present

## 2019-01-28 MED ORDER — SODIUM CHLORIDE 0.9 % IV SOLN: 500.0000 mL | Freq: Once | INTRAVENOUS | Status: DC

## 2019-01-28 NOTE — Patient Instructions (Signed)
YOU HAD AN ENDOSCOPIC PROCEDURE TODAY AT THE [facility]:   Refer to the procedure report that was given to you for any specific questions about what was found during the examination.  If the procedure report does not answer your questions, please call your gastroenterologist to clarify.  If you requested that your care partner not be given the details of your procedure findings, then the procedure report has been included in a sealed envelope for you to review at your convenience later.  YOU SHOULD EXPECT: Some feelings of bloating in the abdomen. Passage of more gas than usual.  Walking can help get rid of the air that was put into your GI tract during the procedure and reduce the bloating. If you had a lower endoscopy (such as a colonoscopy or flexible sigmoidoscopy) you may notice spotting of blood in your stool or on the toilet paper. If you underwent a bowel prep for your procedure, you may not have a normal bowel movement for a few days.  Please Note:  You might notice some irritation and congestion in your nose or some drainage.  This is from the oxygen used during your procedure.  There is no need for concern and it should clear up in a day or so.  SYMPTOMS TO REPORT IMMEDIATELY:   Following lower endoscopy (colonoscopy or flexible sigmoidoscopy):  Excessive amounts of blood in the stool  Significant tenderness or worsening of abdominal pains  Swelling of the abdomen that is new, acute  Fever of 100F or higher  For urgent or emergent issues, a gastroenterologist can be reached at any hour by calling [phone].   DIET:  We do recommend a small meal at first, but then you may proceed to your regular diet.  Drink plenty of fluids but you should avoid alcoholic beverages for 24 hours.  ACTIVITY:  You should plan to take it easy for the rest of today and you should NOT DRIVE or use heavy machinery until tomorrow (because of the sedation medicines used during the test).     FOLLOW UP: Our staff will call the number listed on your records the next business day following your procedure to check on you and address any questions or concerns that you may have regarding the information given to you following your procedure. If we do not reach you, we will leave a message.  However, if you are feeling well and you are not experiencing any problems, there is no need to return our call.  We will assume that you have returned to your regular daily activities without incident.  If any biopsies were taken you will be contacted by phone or by letter within the next 1-3 weeks.  Please call us at [phone] if you have not heard about the biopsies in 3 weeks.    SIGNATURES/CONFIDENTIALITY: You and/or your care partner have signed paperwork which will be entered into your electronic medical record.  These signatures attest to the fact that that the information above on your After Visit Summary has been reviewed and is understood.  Full responsibility of the confidentiality of this discharge information lies with you and/or your care-partner. 

## 2019-01-28 NOTE — Progress Notes (Signed)
Called to room to assist during endoscopic procedure.  Patient ID and intended procedure confirmed with present staff. Received instructions for my participation in the procedure from the performing physician.  

## 2019-01-28 NOTE — Progress Notes (Signed)
Pt's states no medical or surgical changes since previsit or office visit. 

## 2019-01-28 NOTE — Op Note (Addendum)
Katie Williams Procedure Date: 01/28/2019 11:15 AM MRN: [id number] Endoscopist: Ladene Artist , MD Age: 53 Referring MD:  Date of Birth: [birth year] Gender: Female Account #: [id number] Procedure:                Colonoscopy Indications:              Surveillance: Personal history of adenomatous                            polyps on last colonoscopy 3 years ago. Familly                            history of colon cancer. Medicines:                Monitored Anesthesia Care Procedure:                Pre-Anesthesia Assessment:                           - Prior to the procedure, a History and Physical                            was performed, and patient medications and                            allergies were reviewed. The patient's tolerance of                            previous anesthesia was also reviewed. The risks                            and benefits of the procedure and the sedation                            options and risks were discussed with the patient.                            All questions were answered, and informed consent                            was obtained. Prior Anticoagulants: The patient has                            taken no previous anticoagulant or antiplatelet                            agents. ASA Grade Assessment: I - A normal, healthy                            patient. After reviewing the risks and benefits,                            the patient was deemed in satisfactory condition to  undergo the procedure.                           After obtaining informed consent, the colonoscope                            was passed under direct vision. Throughout the                            procedure, the patient's blood pressure, pulse, and                            oxygen saturations were monitored continuously. The                            Colonoscope was introduced through the anus and                           advanced to the the cecum, identified by                            appendiceal orifice and ileocecal valve. The                            ileocecal valve, appendiceal orifice, and rectum                            were photographed. The quality of the bowel                            preparation was excellent. The colonoscopy was                            performed without difficulty. The patient tolerated                            the procedure well. Scope In: 11:29:52 AM Scope Out: 11:43:43 AM Scope Withdrawal Time: 0 hours 11 minutes 40 seconds  Total Procedure Duration: 0 hours 13 minutes 51 seconds  Findings:                 The perianal and digital rectal examinations were                            normal.                           A 7 mm polyp was found in the ascending colon. The                            polyp was sessile. The polyp was removed with a                            cold snare. Resection and retrieval were complete.  A few small-mouthed diverticula were found in the                            left colon.                           Internal hemorrhoids were found during                            retroflexion. The hemorrhoids were small and Grade                            I (internal hemorrhoids that do not prolapse).                           The exam was otherwise without abnormality on                            direct and retroflexion views. Complications:            No immediate complications. Estimated blood loss:                            None. Estimated Blood Loss:     Estimated blood loss: none. Impression:               - One 7 mm polyp in the ascending colon, removed                            with a cold snare. Resected and retrieved.                           - Diverticulosis in the left colon.                           - Internal hemorrhoids.                           - The examination was  otherwise normal on direct                            and retroflexion views. Recommendation:           - Repeat colonoscopy for surveillance, timing                            determined after pathology review.                           - Patient has a contact number available for                            emergencies. The signs and symptoms of potential                            delayed complications were discussed with the  patient. Return to normal activities tomorrow.                            Written discharge instructions were provided to the                            patient.                           - Resume previous diet.                           - Continue present medications.                           - Await pathology results. Ladene Artist, MD 01/28/2019 11:49:23 AM This report has been signed electronically.

## 2019-01-28 NOTE — Progress Notes (Signed)
Report to PACU, RN, vss, BBS= Clear.  

## 2019-01-31 ENCOUNTER — Telehealth: Payer: Self-pay

## 2019-01-31 NOTE — Telephone Encounter (Signed)
  Follow up Call-  Call back number 01/28/2019  Post procedure Call Back phone  # [phone]  Permission to leave phone message Yes  Some recent data might be hidden     Patient questions:  Do you have a fever, pain , or abdominal swelling? No. Pain Score  0 *  Have you tolerated food without any problems? Yes.    Have you been able to return to your normal activities? Yes.    Do you have any questions about your discharge instructions: Diet   No. Medications  No. Follow up visit  No.  Do you have questions or concerns about your Care? No.  Actions: * If pain score is 4 or above: No action needed, pain <4.

## 2019-02-02 ENCOUNTER — Encounter: Payer: Self-pay | Admitting: Gastroenterology

## 2019-03-02 ENCOUNTER — Other Ambulatory Visit: Payer: Self-pay | Admitting: Obstetrics & Gynecology

## 2019-04-05 ENCOUNTER — Other Ambulatory Visit: Payer: Self-pay | Admitting: Obstetrics & Gynecology

## 2019-04-05 DIAGNOSIS — Z1231 Encounter for screening mammogram for malignant neoplasm of breast: Secondary | ICD-10-CM

## 2019-04-07 ENCOUNTER — Other Ambulatory Visit: Payer: Self-pay

## 2019-04-08 ENCOUNTER — Encounter: Payer: Self-pay | Admitting: Obstetrics & Gynecology

## 2019-04-08 ENCOUNTER — Encounter (INDEPENDENT_AMBULATORY_CARE_PROVIDER_SITE_OTHER): Payer: Self-pay

## 2019-04-08 ENCOUNTER — Ambulatory Visit (INDEPENDENT_AMBULATORY_CARE_PROVIDER_SITE_OTHER): Payer: BLUE CROSS/BLUE SHIELD | Admitting: Obstetrics & Gynecology

## 2019-04-08 VITALS — BP 144/92 | Ht 68.0 in | Wt 178.0 lb

## 2019-04-08 DIAGNOSIS — Z30011 Encounter for initial prescription of contraceptive pills: Secondary | ICD-10-CM | POA: Diagnosis not present

## 2019-04-08 DIAGNOSIS — Z01419 Encounter for gynecological examination (general) (routine) without abnormal findings: Secondary | ICD-10-CM | POA: Diagnosis not present

## 2019-04-08 DIAGNOSIS — Z800 Family history of malignant neoplasm of digestive organs: Secondary | ICD-10-CM | POA: Diagnosis not present

## 2019-04-08 DIAGNOSIS — N951 Menopausal and female climacteric states: Secondary | ICD-10-CM

## 2019-04-08 DIAGNOSIS — I10 Essential (primary) hypertension: Secondary | ICD-10-CM

## 2019-04-08 MED ORDER — ESTRADIOL 0.1 MG/GM VA CREA: 0.2500 | TOPICAL_CREAM | VAGINAL | 4 refills | Status: DC

## 2019-04-08 MED ORDER — NORETHINDRONE 0.35 MG PO TABS: 1.0000 | ORAL_TABLET | Freq: Every day | ORAL | 4 refills | Status: DC

## 2019-04-08 NOTE — Progress Notes (Signed)
Katie Williams [birth year] [id number]   History:    53 y.o. G0 Married.  Works for [employer].  RP:  Established patient presenting for annual gyn exam   HPI: On Tri-Sprintec for contraception with frequent breakthrough bleeding.  No pelvic pain.  Patient experiencing mild hot flushes frequently.  Increased anxiety.  Using Estrace cream for dryness with intercourse.  Urine and bowel movements normal.  Breasts normal.  BMI 27.06.  Very good muscle mass.  Very fit.  Healthy nutrition.  Health Labs with Fam MD.  Fam h/o Colon Cancer.  Past medical history,surgical history, family history and social history were all reviewed and documented in the EPIC chart.  Gynecologic History Patient's last menstrual period was 03/19/2019. Contraception: OCP (estrogen/progesterone) Last Pap: 08/2016.  Results were: Negative Last mammogram: 12/2017. Results were: Negative.  Scheduled now. Bone Density: Never Colonoscopy: 2020 Benign polyp/Fam H/O Colon Ca.    Obstetric History OB History  Gravida Para Term Preterm AB Living  0 0 0 0 0 0  SAB TAB Ectopic Multiple Live Births  0 0 0 0 0     ROS: A ROS was performed and pertinent positives and negatives are included in the history.  GENERAL: No fevers or chills. HEENT: No change in vision, no earache, sore throat or sinus congestion. NECK: No pain or stiffness. CARDIOVASCULAR: No chest pain or pressure. No palpitations. PULMONARY: No shortness of breath, cough or wheeze. GASTROINTESTINAL: No abdominal pain, nausea, vomiting or diarrhea, melena or bright red blood per rectum. GENITOURINARY: No urinary frequency, urgency, hesitancy or dysuria. MUSCULOSKELETAL: No joint or muscle pain, no back pain, no recent trauma. DERMATOLOGIC: No rash, no itching, no lesions. ENDOCRINE: No polyuria, polydipsia, no heat or cold intolerance. No recent change in weight. HEMATOLOGICAL: No anemia or easy bruising or bleeding. NEUROLOGIC: No headache, seizures, numbness, tingling  or weakness. PSYCHIATRIC: No depression, no loss of interest in normal activity or change in sleep pattern.     Exam:   BP (!) 160/94   Ht 5\' 8"  (1.727 m)   Wt 178 lb (80.7 kg)   LMP 03/19/2019 Comment: pill  BMI 27.06 kg/m   Body mass index is 27.06 kg/m.  General appearance : Well developed well nourished female. No acute distress HEENT: Eyes: no retinal hemorrhage or exudates,  Neck supple, trachea midline, no carotid bruits, no thyroidmegaly Lungs: Clear to auscultation, no rhonchi or wheezes, or rib retractions  Heart: Regular rate and rhythm, no murmurs or gallops Breast:Examined in sitting and supine position were symmetrical in appearance, no palpable masses or tenderness,  no skin retraction, no nipple inversion, no nipple discharge, no skin discoloration, no axillary or supraclavicular lymphadenopathy Abdomen: no palpable masses or tenderness, no rebound or guarding Extremities: no edema or skin discoloration or tenderness  Pelvic: Vulva: Normal             Vagina: No gross lesions or discharge  Cervix: No gross lesions or discharge.  Pap reflex done.  Uterus  AV, normal size, shape and consistency, non-tender and mobile  Adnexa  Without masses or tenderness  Anus: Normal   Assessment/Plan:  53 y.o. female for annual exam   1. Encounter for gynecological examination with Papanicolaou smear of cervix Normal gynecologic exam.  Pap reflex done today.  Breast exam normal.  Rescheduling screening Mammo now.  Health labs with Fam MD. - Pap IG w/ reflex to HPV when ASC-U  2. Encounter for initial prescription of contraceptive pills On Tri-Sprintec with frequent  BTB.  Hypertension de novo today at 160/94 and 144/92.  Recommend urgent visit with Fam MD for management.  Stop Tri-Sprintec.  Will start on the Progestin-only BCPs.  Usage reviewed and prescription sent to pharmacy.  3. Hypertension, unspecified type Hypertension de novo on exam today at 160/94 and 144/92.   Referred to Fam MD for management.  Low salt diat.  Take BPs at home at different times of the day.    4. Perimenopause Continue on BCPs, changed to the Progestin-only pill.  Recommend Vit D supplements, Ca++ intake of 1200-1500 mg daily, regular weight bearing physical activity.  5. Family history of colon cancer Colono 2020 benign polyp.  Other orders - norethindrone (MICRONOR) 0.35 MG tablet; Take 1 tablet (0.35 mg total) by mouth daily. - estradiol (ESTRACE) 0.1 MG/GM vaginal cream; Place 8.41 Applicatorfuls vaginally 2 (two) times a week.  Counseling on above issues and coordination of care >50% x 10 minutes.  [name] MD, 12:52 PM 04/08/2019

## 2019-04-08 NOTE — Patient Instructions (Signed)
1. Encounter for gynecological examination with Papanicolaou smear of cervix Normal gynecologic exam.  Pap reflex done today.  Breast exam normal.  Rescheduling screening Mammo now.  Health labs with Fam MD. - Pap IG w/ reflex to HPV when ASC-U  2. Encounter for initial prescription of contraceptive pills On Tri-Sprintec with frequent BTB.  Hypertension de novo today at 160/94 and 144/92.  Recommend urgent visit with Fam MD for management.  Stop Tri-Sprintec.  Will start on the Progestin-only BCPs.  Usage reviewed and prescription sent to pharmacy.  3. Hypertension, unspecified type Hypertension de novo on exam today at 160/94 and 144/92.  Referred to Fam MD for management.  Low salt diat.  Take BPs at home at different times of the day.    4. Perimenopause Continue on BCPs, changed to the Progestin-only pill.  Recommend Vit D supplements, Ca++ intake of 1200-1500 mg daily, regular weight bearing physical activity.  5. Family history of colon cancer Colono 2020 benign polyp.  Other orders - norethindrone (MICRONOR) 0.35 MG tablet; Take 1 tablet (0.35 mg total) by mouth daily. - estradiol (ESTRACE) 0.1 MG/GM vaginal cream; Place 4.03 Applicatorfuls vaginally 2 (two) times a week.  Katie Williams, it was a pleasure seeing you today!  I will inform you of your results as soon as they are available.

## 2019-04-12 LAB — PAP IG W/ RFLX HPV ASCU

## 2019-04-14 ENCOUNTER — Other Ambulatory Visit: Payer: Self-pay | Admitting: *Deleted

## 2019-04-14 MED ORDER — FLUCONAZOLE 150 MG PO TABS: 150.0000 mg | ORAL_TABLET | Freq: Every day | ORAL | 0 refills | Status: DC

## 2019-05-23 ENCOUNTER — Ambulatory Visit: Payer: BLUE CROSS/BLUE SHIELD

## 2019-07-01 ENCOUNTER — Ambulatory Visit
Admission: RE | Admit: 2019-07-01 | Discharge: 2019-07-01 | Disposition: A | Discharge status: Home / Self Care | Payer: BC Managed Care – PPO | Source: Ambulatory Visit | Attending: Obstetrics & Gynecology | Admitting: Obstetrics & Gynecology

## 2019-07-01 ENCOUNTER — Other Ambulatory Visit: Payer: Self-pay

## 2019-07-01 DIAGNOSIS — Z1231 Encounter for screening mammogram for malignant neoplasm of breast: Secondary | ICD-10-CM | POA: Diagnosis not present

## 2019-10-31 DIAGNOSIS — H40013 Open angle with borderline findings, low risk, bilateral: Secondary | ICD-10-CM | POA: Diagnosis not present

## 2019-11-20 ENCOUNTER — Encounter: Payer: Self-pay | Admitting: Emergency Medicine

## 2019-11-20 ENCOUNTER — Other Ambulatory Visit: Payer: Self-pay

## 2019-11-20 ENCOUNTER — Emergency Department
Admission: EM | Admit: 2019-11-20 | Discharge: 2019-11-20 | Disposition: A | Discharge status: Home / Self Care | Payer: BC Managed Care – PPO | Source: Home / Self Care

## 2019-11-20 DIAGNOSIS — Z20822 Contact with and (suspected) exposure to covid-19: Secondary | ICD-10-CM

## 2019-11-20 DIAGNOSIS — J069 Acute upper respiratory infection, unspecified: Secondary | ICD-10-CM

## 2019-11-20 MED ORDER — ALBUTEROL SULFATE HFA 108 (90 BASE) MCG/ACT IN AERS: 2.0000 | INHALATION_SPRAY | Freq: Four times a day (QID) | RESPIRATORY_TRACT | Status: DC | PRN

## 2019-11-20 MED ORDER — BENZONATATE 100 MG PO CAPS: 100.0000 mg | ORAL_CAPSULE | Freq: Three times a day (TID) | ORAL | 0 refills | Status: DC | PRN

## 2019-11-20 NOTE — ED Provider Notes (Signed)
Katie Williams [facility]    CSN: [id number] Arrival date & time: 11/20/19  1513      History   Chief Complaint Chief Complaint  Patient presents with  . Cough    HPI Katie Williams is a 54 y.o. female.   HPI  Katie Williams presents today for evaluation of cough and COVID-19 testing s/p exposure to mother that tested positive 2 days prior.  Onset of symptoms:  Several days ago. Symptoms include:Chest tightness, non-productive cough, headache, overall feeling poorly in comparison to normal baseline. Denies audible wheeze, history of asthma , PNA, bronchitis, or COPD. No diagnosis associated with high risk complications of [id number]. Past Medical History:  Diagnosis Date  . Cataract     There are no problems to display for this patient.   Past Surgical History:  Procedure Laterality Date  . COLONOSCOPY  last 01/01/2016  . no prior surgery    . POLYPECTOMY      OB History    Gravida  0   Para  0   Term  0   Preterm  0   AB  0   Living  0     SAB  0   TAB  0   Ectopic  0   Multiple  0   Live Births  0            Home Medications    Prior to Admission medications   Medication Sig Start Date End Date Taking? Authorizing Provider  hydrochlorothiazide (HYDRODIURIL) 25 MG tablet Take 25 mg by mouth daily. 09/23/19  Yes [provider]  estradiol (ESTRACE) 0.1 MG/GM vaginal cream Place [id number] Applicatorfuls vaginally 2 (two) times a week. 04/11/19   [name], MD  norethindrone (MICRONOR) 0.35 MG tablet Take 1 tablet (0.35 mg total) by mouth daily. 04/08/19   [name], MD    Family History Family History  Problem Relation Age of Onset  . Colon cancer Father 23  . Hypertension Father   . Colon cancer Paternal Grandmother 31  . Colon cancer Paternal Grandfather 58  . Colon polyps Brother 35       precancerous polyps  . Hypertension Mother   . Stomach cancer Neg Hx   . Esophageal cancer Neg Hx   . Rectal cancer  Neg Hx     Social History Social History   Tobacco Use  . Smoking status: Never Smoker  . Smokeless tobacco: Never Used  Substance Use Topics  . Alcohol use: Yes    Alcohol/week: 4.0 standard drinks    Types: 4 Glasses of wine per week  . Drug use: No     Allergies   Patient has no known allergies. Review of Systems Review of Systems Pertinent negatives listed in HPI Physical Exam Triage Vital Signs ED Triage Vitals  Enc Vitals Group     BP 11/20/19 1612 (!) 133/92     Pulse Rate 11/20/19 1612 86     Resp --      Temp 11/20/19 1612 98.5 F (36.9 C)     Temp Source 11/20/19 1612 Oral     SpO2 11/20/19 1612 98 %     Weight 11/20/19 1613 191 lb 12 oz (87 kg)     Height 11/20/19 1613 5' 8.5" (1.74 m)     Head Circumference --      Peak Flow --      Pain Score 11/20/19 1612 0     Pain Loc --  Pain Edu? --      Excl. in [place]? --    No data found.  Updated Vital Signs BP (!) 133/92 (BP Location: Right Arm)   Pulse 86   Temp 98.5 F (36.9 C) (Oral)   Ht 5' 8.5" (1.74 m)   Wt 191 lb 12 oz (87 kg)   SpO2 98%   BMI 28.73 kg/m   Visual Acuity Right Eye Distance:   Left Eye Distance:   Bilateral Distance:    Right Eye Near:   Left Eye Near:    Bilateral Near:     Physical Exam General appearance: alert, well developed, well nourished, cooperative and in no distress Head: Normocephalic, without obvious abnormality, atraumatic Respiratory: Respirations even and unlabored, normal respiratory rate, Rhonchus dry cough noted during exam   Heart: rate and rhythm normal. No gallop or murmurs noted on exam  Abdomen: no distention, no rebound tenderness Extremities: No gross deformities Skin: Skin color, texture, turgor normal. No rashes seen  Psych: Appropriate mood and affect. Neurologic:Alert, oriented to person, place, and time, thought content appropriate.  UC Treatments / Results  Labs (all labs ordered are listed, but only abnormal results are displayed)  Labs Reviewed  NOVEL CORONAVIRUS, NAA    EKG   Radiology No results found.  Procedures Procedures (including critical care time)  Medications Ordered in UC Medications - No data to display  Initial Impression / Assessment and Plan / UC Course  I have reviewed the triage vital signs and the nursing notes.  Pertinent labs & imaging results that were available during my care of the patient were reviewed by me and considered in my medical decision making (see chart for details).   Exposure to COVID-19 virus, Covid-19 testing pending. Benzonatate prescribed for PRN use for cough. Recommendations to continue social isolation until results are known. Encourage hydration and rest. Red flags discussed. Patient verbalized understanding and agreement in plan. Final Clinical Impressions(s) / UCDiagnoses   Final diagnoses:  Exposure to COVID-19 virus   Discharge Instructions   None    ED Prescriptions    Medication Sig Dispense Auth. Provider   benzonatate (TESSALON) 100 MG capsule Take 1-2 capsules (100-200 mg total) by mouth 3 (three) times daily as needed for cough. 40 capsule Scot Jun, FNP     PDMP not reviewed this encounter.   Kacey, Kamada, FNP 11/21/19 1544

## 2019-11-20 NOTE — ED Triage Notes (Signed)
Patient c/o non-productive cough for several days, some chest tightness, slight HA, exposure to COVID - mom diagnosed on Friday with COVID.

## 2019-11-21 LAB — NOVEL CORONAVIRUS, NAA: SARS-CoV-2, NAA: NOT DETECTED

## 2019-12-07 DIAGNOSIS — H2513 Age-related nuclear cataract, bilateral: Secondary | ICD-10-CM | POA: Diagnosis not present

## 2019-12-07 DIAGNOSIS — H2511 Age-related nuclear cataract, right eye: Secondary | ICD-10-CM | POA: Diagnosis not present

## 2019-12-12 NOTE — Progress Notes (Signed)
[facility] Note  12/14/2019     CHIEF COMPLAINT Patient presents for Retina Evaluation   HISTORY OF PRESENT ILLNESS: Katie Williams is a 54 y.o. female who presents to the clinic today for:   HPI    Retina Evaluation    In both eyes.  This started 6 years ago.  Duration of 6 years.  Context:  distance vision and near vision.  I, the attending physician,  performed the HPI with the patient and updated documentation appropriately.          Comments    New pt retina eval ref by Dr. Lucita Ferrara Pt states her vision has gotten worse over the last 6-7 years.  Pt complains that her left eye is worse than right during screening.  Patient denies eye pain or discomfort and denies any new or worsening floaters or fol OU.  Pt is currently scheduled for CE/IOL OD 01/10/2020       Last edited by Bernarda Caffey, MD on 12/14/2019  2:47 PM. (History)    pt is here on the referral of Dr. Lucita Ferrara for cataract clearance, she states the right eye is scheduled for March 2 and the left eye is March 9, pt works at [employer] in the [employer], pt states she recently started taking medication for high blood pressure, pt denies fol, pt states she has been wearing glasses since second grade  Referring physician: Vevelyn Royals, [address] [place], [place] [address]   HISTORICAL INFORMATION:   Selected notes from the [place] Referral from Dr. Dorthea Cove retinal evaluation prior to cataract surgery.  LEE: 12/07/19 BCVA: OD: 20/30 OS: 20/50  PMH: HTN   CURRENT MEDICATIONS: No current outpatient medications on file. (Ophthalmic Drugs)   No current facility-administered medications for this visit. (Ophthalmic Drugs)   Current Outpatient Medications (Other)  Medication Sig  . benzonatate (TESSALON) 100 MG capsule Take 1-2 capsules (100-200 mg total) by mouth 3 (three) times daily as needed for cough.  . estradiol  (ESTRACE) 0.1 MG/GM vaginal cream Place [id number] Applicatorfuls vaginally 2 (two) times a week.  . hydrochlorothiazide (HYDRODIURIL) 25 MG tablet Take 25 mg by mouth daily.  . norethindrone (MICRONOR) 0.35 MG tablet Take 1 tablet (0.35 mg total) by mouth daily.   No current facility-administered medications for this visit. (Other)      REVIEW OF SYSTEMS: ROS    Positive for: Eyes   Negative for: Constitutional, Gastrointestinal, Neurological, Skin, Genitourinary, Musculoskeletal, HENT, Endocrine, Cardiovascular, Respiratory, Psychiatric, Allergic/Imm, Heme/Lymph   Last edited by Doneen Poisson on 12/14/2019  8:37 AM. (History)       ALLERGIES No Known Allergies  PAST MEDICAL HISTORY Past Medical History:  Diagnosis Date  . Cataract    Past Surgical History:  Procedure Laterality Date  . COLONOSCOPY  last 01/01/2016  . no prior surgery    . POLYPECTOMY      FAMILY HISTORY Family History  Problem Relation Age of Onset  . Colon cancer Father 6  . Hypertension Father   . Colon cancer Paternal Grandmother 48  . Colon cancer Paternal Grandfather 80  . Colon polyps Brother 35       precancerous polyps  . Hypertension Mother   . Stomach cancer Neg Hx   . Esophageal cancer Neg Hx   . Rectal cancer Neg Hx     SOCIAL HISTORY Social History   Tobacco Use  . Smoking status: Never Smoker  .  Smokeless tobacco: Never Used  Substance Use Topics  . Alcohol use: Yes    Alcohol/week: 4.0 standard drinks    Types: 4 Glasses of wine per week  . Drug use: No         OPHTHALMIC EXAM:  Base Eye Exam    Visual Acuity (Snellen - Linear)      Right Left   Dist cc 20/50 -1 20/50 -1   Dist ph cc 20/40 -1 20/40 +2   Correction: Glasses       Tonometry (Tonopen, 8:33 AM)      Right Left   Pressure 22 19       Pupils      Dark Light Shape React APD   Right 3 2 Round Brisk 0   Left 3 2 Round Brisk 0       Visual Fields      Left Right    Full Full        Extraocular Movement      Right Left    Full Full       Neuro/Psych    Oriented x3: Yes   Mood/Affect: Normal       Dilation    Both eyes: 1.0% Mydriacyl, 2.5% Phenylephrine @ 8:35 AM        Slit Lamp and Fundus Exam    Slit Lamp Exam      Right Left   Lids/Lashes Normal Normal   Conjunctiva/Sclera White and quiet Trace, temporal Pinguecula   Cornea Trace Punctate epithelial erosions Trace Punctate epithelial erosions   Anterior Chamber Deep and quiet Deep and quiet   Iris Round and dilated Round and dilated   Lens 1+ Nuclear sclerosis, 2-3+ Cortical cataract 1+ Nuclear sclerosis, 2-3+ Cortical cataract   Vitreous Vitreous syneresis Vitreous syneresis       Fundus Exam      Right Left   Disc Pink and Sharp, mildly Tilted disc Pink and Sharp, mildly Tilted disc, temporal PPA   C/D Ratio 0.4 0.5   Macula Flat, Good foveal reflex, RPE mottling and clumping, No heme or edema Flat, Good foveal reflex, RPE mottling and clumping, No heme or edema   Vessels Mild Vascular attenuation Mild Vascular attenuation   Periphery Attached, paving stone degeneration inferiorly, No RT/RD Attached, focal peripheral paving stone at 0200, paving stone degeneration inferiorly, No RT/RD        Refraction    Wearing Rx      Sphere Cylinder   Right -9.25 Sphere   Left -9.75 Sphere       Manifest Refraction      Sphere Cylinder Axis Dist VA   Right -9.50 +1.00 065 20/40   Left -10.50 Sphere  20/40-1          IMAGING AND PROCEDURES  Imaging and Procedures for @TODAY @  OCT, Retina - OU - Both Eyes       Right Eye Quality was good. Central Foveal Thickness: 277. Progression has no prior data. Findings include normal foveal contour, no IRF, no SRF, vitreomacular adhesion , myopic contour.   Left Eye Quality was good. Central Foveal Thickness: 275. Progression has no prior data. Findings include normal foveal contour, no IRF, no SRF, vitreomacular adhesion , myopic contour.    Notes *Images captured and stored on drive  Diagnosis / Impression:  NFP, no IRF/SRF OU Myopic contour OU  Clinical management:  See below  Abbreviations: NFP - Normal foveal profile. CME - cystoid macular edema. PED - pigment  epithelial detachment. IRF - intraretinal fluid. SRF - subretinal fluid. EZ - ellipsoid zone. ERM - epiretinal membrane. ORA - outer retinal atrophy. ORT - outer retinal tubulation. SRHM - subretinal hyper-reflective material                 ASSESSMENT/PLAN:    ICD-10-CM   1. Combined forms of age-related cataract of both eyes  H25.813   2. Retinal edema  H35.81 OCT, Retina - OU - Both Eyes  3. Severe myopia of both eyes  H52.13     1. Mixed form age related cataract OU  - The symptoms of cataract, surgical options, and treatments and risks were discussed with patient.  - discussed diagnosis and progression  - clear from a retina standpoint to proceed with cataract surgery when pt and surgeon are ready  - right eye surgery scheduled for January 10, 2020 with Dr. Lucita Ferrara  - f/u here prn  2. No retinal edema on exam or OCT  3. High Myopia OU  - discussed association of high myopia with lattice degeneration and increased risk of RT/RD  - no lattice, RT/RD on 360 exam  - f/u here prn   Ophthalmic Meds Ordered this visit:  No orders of the defined types were placed in this encounter.      Return if symptoms worsen or fail to improve.  There are no Patient Instructions on file for this visit.   Explained the diagnoses, plan, and follow up with the patient and they expressed understanding.  Patient expressed understanding of the importance of proper follow up care.   Gardiner Sleeper, M.D., Ph.D. Diseases & Surgery of the Retina and Vitreous [facility]  I have reviewed the above documentation for accuracy and completeness, and I agree with the above. Gardiner Sleeper, M.D., Ph.D. 12/14/19 2:50  PM    Abbreviations: M myopia (nearsighted); A astigmatism; H hyperopia (farsighted); P presbyopia; Mrx spectacle prescription;  CTL contact lenses; OD right eye; OS left eye; OU both eyes  XT exotropia; ET esotropia; PEK punctate epithelial keratitis; PEE punctate epithelial erosions; DES dry eye syndrome; MGD meibomian gland dysfunction; ATs artificial tears; PFAT's preservative free artificial tears; [place] nuclear sclerotic cataract; PSC posterior subcapsular cataract; ERM epi-retinal membrane; PVD posterior vitreous detachment; RD retinal detachment; DM diabetes mellitus; DR diabetic retinopathy; NPDR non-proliferative diabetic retinopathy; PDR proliferative diabetic retinopathy; CSME clinically significant macular edema; DME diabetic macular edema; dbh dot blot hemorrhages; CWS cotton wool spot; POAG primary open angle glaucoma; C/D cup-to-disc ratio; HVF humphrey visual field; GVF goldmann visual field; OCT optical coherence tomography; IOP intraocular pressure; BRVO Branch retinal vein occlusion; CRVO central retinal vein occlusion; CRAO central retinal artery occlusion; BRAO branch retinal artery occlusion; RT retinal tear; SB scleral buckle; PPV pars plana vitrectomy; VH Vitreous hemorrhage; PRP panretinal laser photocoagulation; IVK intravitreal kenalog; VMT vitreomacular traction; MH Macular hole;  NVD neovascularization of the disc; NVE neovascularization elsewhere; AREDS age related eye disease study; ARMD age related macular degeneration; POAG primary open angle glaucoma; EBMD epithelial/anterior basement membrane dystrophy; ACIOL anterior chamber intraocular lens; IOL intraocular lens; PCIOL posterior chamber intraocular lens; Phaco/IOL phacoemulsification with intraocular lens placement; [place] photorefractive keratectomy; LASIK laser assisted in situ keratomileusis; HTN hypertension; DM diabetes mellitus; COPD chronic obstructive pulmonary disease

## 2019-12-14 ENCOUNTER — Encounter (INDEPENDENT_AMBULATORY_CARE_PROVIDER_SITE_OTHER): Payer: Self-pay | Admitting: Ophthalmology

## 2019-12-14 ENCOUNTER — Ambulatory Visit (INDEPENDENT_AMBULATORY_CARE_PROVIDER_SITE_OTHER): Payer: BLUE CROSS/BLUE SHIELD | Admitting: Ophthalmology

## 2019-12-14 DIAGNOSIS — H25813 Combined forms of age-related cataract, bilateral: Secondary | ICD-10-CM

## 2019-12-14 DIAGNOSIS — H5213 Myopia, bilateral: Secondary | ICD-10-CM

## 2019-12-14 DIAGNOSIS — H3581 Retinal edema: Secondary | ICD-10-CM

## 2019-12-28 DIAGNOSIS — H25043 Posterior subcapsular polar age-related cataract, bilateral: Secondary | ICD-10-CM | POA: Diagnosis not present

## 2019-12-28 DIAGNOSIS — H2513 Age-related nuclear cataract, bilateral: Secondary | ICD-10-CM | POA: Diagnosis not present

## 2019-12-28 DIAGNOSIS — H25013 Cortical age-related cataract, bilateral: Secondary | ICD-10-CM | POA: Diagnosis not present

## 2020-01-04 DIAGNOSIS — H25013 Cortical age-related cataract, bilateral: Secondary | ICD-10-CM | POA: Diagnosis not present

## 2020-01-04 DIAGNOSIS — H2513 Age-related nuclear cataract, bilateral: Secondary | ICD-10-CM | POA: Diagnosis not present

## 2020-01-04 DIAGNOSIS — H25043 Posterior subcapsular polar age-related cataract, bilateral: Secondary | ICD-10-CM | POA: Diagnosis not present

## 2020-01-10 DIAGNOSIS — H25012 Cortical age-related cataract, left eye: Secondary | ICD-10-CM | POA: Diagnosis not present

## 2020-01-10 DIAGNOSIS — H2512 Age-related nuclear cataract, left eye: Secondary | ICD-10-CM | POA: Diagnosis not present

## 2020-01-10 DIAGNOSIS — H2511 Age-related nuclear cataract, right eye: Secondary | ICD-10-CM | POA: Diagnosis not present

## 2020-01-10 DIAGNOSIS — H25811 Combined forms of age-related cataract, right eye: Secondary | ICD-10-CM | POA: Diagnosis not present

## 2020-01-10 DIAGNOSIS — H25042 Posterior subcapsular polar age-related cataract, left eye: Secondary | ICD-10-CM | POA: Diagnosis not present

## 2020-01-17 DIAGNOSIS — H25812 Combined forms of age-related cataract, left eye: Secondary | ICD-10-CM | POA: Diagnosis not present

## 2020-01-17 DIAGNOSIS — H2512 Age-related nuclear cataract, left eye: Secondary | ICD-10-CM | POA: Diagnosis not present

## 2020-04-11 ENCOUNTER — Encounter: Payer: BC Managed Care – PPO | Admitting: Obstetrics & Gynecology

## 2020-05-02 DIAGNOSIS — H26491 Other secondary cataract, right eye: Secondary | ICD-10-CM | POA: Diagnosis not present

## 2020-05-09 DIAGNOSIS — H26492 Other secondary cataract, left eye: Secondary | ICD-10-CM | POA: Diagnosis not present

## 2020-05-23 DIAGNOSIS — Z961 Presence of intraocular lens: Secondary | ICD-10-CM | POA: Diagnosis not present

## 2020-06-04 ENCOUNTER — Other Ambulatory Visit: Payer: Self-pay | Admitting: Obstetrics & Gynecology

## 2020-06-04 DIAGNOSIS — Z1231 Encounter for screening mammogram for malignant neoplasm of breast: Secondary | ICD-10-CM

## 2020-06-11 ENCOUNTER — Ambulatory Visit (INDEPENDENT_AMBULATORY_CARE_PROVIDER_SITE_OTHER): Payer: BC Managed Care – PPO | Admitting: Obstetrics & Gynecology

## 2020-06-11 ENCOUNTER — Encounter: Payer: Self-pay | Admitting: Obstetrics & Gynecology

## 2020-06-11 ENCOUNTER — Other Ambulatory Visit: Payer: Self-pay

## 2020-06-11 VITALS — BP 136/92 | Ht 68.0 in | Wt 195.0 lb

## 2020-06-11 DIAGNOSIS — E663 Overweight: Secondary | ICD-10-CM

## 2020-06-11 DIAGNOSIS — Z3041 Encounter for surveillance of contraceptive pills: Secondary | ICD-10-CM

## 2020-06-11 DIAGNOSIS — N951 Menopausal and female climacteric states: Secondary | ICD-10-CM

## 2020-06-11 DIAGNOSIS — Z01419 Encounter for gynecological examination (general) (routine) without abnormal findings: Secondary | ICD-10-CM

## 2020-06-11 MED ORDER — NORETHIN ACE-ETH ESTRAD-FE 1-20 MG-MCG(24) PO TABS: 1.0000 | ORAL_TABLET | Freq: Every day | ORAL | 4 refills | Status: DC

## 2020-06-11 NOTE — Progress Notes (Signed)
Katie Williams [facility] 07/21/1966 [id number]   History:    54 y.o.  G0 Married.  Works for [employer].  RP:  Established patient presenting for annual gyn exam   HPI: On the Progestin only BCPs.  Light occasional menses.  Hot flushes increasing and night sweats most nights.  No pelvic pain. Using Estrace cream for dryness with intercourse.  Urine and bowel movements normal. Breasts normal.  BMI 29.65.  Very good muscle mass.  Was very fit, starting back at the [employer].  Healthy nutrition.  Health Labs with Fam MD.  Colonoscopy 2020, q3 yrs. Fam h/o Colon Cancer.   Past medical history,surgical history, family history and social history were all reviewed and documented in the EPIC chart.  Gynecologic History No LMP recorded. Patient is premenopausal.  Obstetric History OB History  Gravida Para Term Preterm AB Living  0 0 0 0 0 0  SAB TAB Ectopic Multiple Live Births  0 0 0 0 0     ROS: A ROS was performed and pertinent positives and negatives are included in the history.  GENERAL: No fevers or chills. HEENT: No change in vision, no earache, sore throat or sinus congestion. NECK: No pain or stiffness. CARDIOVASCULAR: No chest pain or pressure. No palpitations. PULMONARY: No shortness of breath, cough or wheeze. GASTROINTESTINAL: No abdominal pain, nausea, vomiting or diarrhea, melena or bright red blood per rectum. GENITOURINARY: No urinary frequency, urgency, hesitancy or dysuria. MUSCULOSKELETAL: No joint or muscle pain, no back pain, no recent trauma. DERMATOLOGIC: No rash, no itching, no lesions. ENDOCRINE: No polyuria, polydipsia, no heat or cold intolerance. No recent change in weight. HEMATOLOGICAL: No anemia or easy bruising or bleeding. NEUROLOGIC: No headache, seizures, numbness, tingling or weakness. PSYCHIATRIC: No depression, no loss of interest in normal activity or change in sleep pattern.     Exam:   BP (!) 136/92   Ht 5\' 8"  (1.727 m)   Wt 195 lb (88.5 kg)   BMI 29.65  kg/m   Body mass index is 29.65 kg/m.  General appearance : Well developed well nourished female. No acute distress HEENT: Eyes: no retinal hemorrhage or exudates,  Neck supple, trachea midline, no carotid bruits, no thyroidmegaly Lungs: Clear to auscultation, no rhonchi or wheezes, or rib retractions  Heart: Regular rate and rhythm, no murmurs or gallops Breast:Examined in sitting and supine position were symmetrical in appearance, no palpable masses or tenderness,  no skin retraction, no nipple inversion, no nipple discharge, no skin discoloration, no axillary or supraclavicular lymphadenopathy Abdomen: no palpable masses or tenderness, no rebound or guarding Extremities: no edema or skin discoloration or tenderness  Pelvic: Vulva: Normal             Vagina: No gross lesions or discharge  Cervix: No gross lesions or discharge.  Pap reflex done.  Uterus  AV, normal size, shape and consistency, non-tender and mobile  Adnexa  Without masses or tenderness  Anus: Normal   Assessment/Plan:  54 y.o. female for annual exam   1. Encounter for gynecological examination with Papanicolaou smear of cervix Normal gynecologic exam.  Pap reflex done.  Breast exam normal.  Screening mammogram August 2020 was negative.  Colonoscopies every 3 years because of positive family history of colon cancer.  Health labs with family physician.    2. Perimenopause Still needs contraception.    3. Encounter for surveillance of contraceptive pills cHTN controled on Meds.  Will check closely.  If BP increased on low dose BCPs,  will call me to change contraception. - Norethindrone Acetate-Ethinyl Estrad-FE (LOESTRIN 24 FE) 1-20 MG-MCG(24) tablet; Take 1 tablet by mouth daily.  4. Overweight (BMI 25.0-29.9) Body mass index 29.65.  Recommend lower calorie/carb diet and increase aerobic activities to 5 times a week with light weightlifting every 2 days  [name] MD, 4:52 PM 06/11/2020

## 2020-06-12 DIAGNOSIS — Z01419 Encounter for gynecological examination (general) (routine) without abnormal findings: Secondary | ICD-10-CM | POA: Diagnosis not present

## 2020-06-13 LAB — PAP IG W/ RFLX HPV ASCU

## 2020-06-15 ENCOUNTER — Encounter: Payer: Self-pay | Admitting: Obstetrics & Gynecology

## 2020-06-15 MED ORDER — ESTRADIOL 0.1 MG/GM VA CREA: TOPICAL_CREAM | VAGINAL | 4 refills | Status: DC

## 2020-07-02 ENCOUNTER — Other Ambulatory Visit: Payer: Self-pay

## 2020-07-02 ENCOUNTER — Ambulatory Visit
Admission: RE | Admit: 2020-07-02 | Discharge: 2020-07-02 | Disposition: A | Discharge status: Home / Self Care | Payer: BC Managed Care – PPO | Source: Ambulatory Visit | Attending: Obstetrics & Gynecology | Admitting: Obstetrics & Gynecology

## 2020-07-02 DIAGNOSIS — Z1231 Encounter for screening mammogram for malignant neoplasm of breast: Secondary | ICD-10-CM

## 2020-07-11 DIAGNOSIS — Z8616 Personal history of COVID-19: Secondary | ICD-10-CM

## 2020-07-11 HISTORY — DX: Personal history of COVID-19: Z86.16

## 2020-07-16 ENCOUNTER — Ambulatory Visit
Admission: EM | Admit: 2020-07-16 | Discharge: 2020-07-16 | Disposition: A | Discharge status: Home / Self Care | Payer: BC Managed Care – PPO | Attending: Family Medicine | Admitting: Family Medicine

## 2020-07-16 ENCOUNTER — Encounter: Payer: Self-pay | Admitting: Emergency Medicine

## 2020-07-16 ENCOUNTER — Other Ambulatory Visit: Payer: Self-pay

## 2020-07-16 DIAGNOSIS — J069 Acute upper respiratory infection, unspecified: Secondary | ICD-10-CM

## 2020-07-16 DIAGNOSIS — Z20822 Contact with and (suspected) exposure to covid-19: Secondary | ICD-10-CM

## 2020-07-16 MED ORDER — BENZONATATE 100 MG PO CAPS: 100.0000 mg | ORAL_CAPSULE | Freq: Three times a day (TID) | ORAL | 0 refills | Status: DC

## 2020-07-16 MED ORDER — IPRATROPIUM BROMIDE 0.03 % NA SOLN: 2.0000 | Freq: Two times a day (BID) | NASAL | 0 refills | Status: DC

## 2020-07-16 MED ORDER — LEVOCETIRIZINE DIHYDROCHLORIDE 5 MG PO TABS: 5.0000 mg | ORAL_TABLET | Freq: Every evening | ORAL | 0 refills | Status: DC

## 2020-07-16 NOTE — ED Triage Notes (Signed)
Woke Thursday night with head fullness, slight fever yesterday.  Complains of cough and cough interrupts sleeping at night  Loss of smell, can still taste

## 2020-07-16 NOTE — Discharge Instructions (Signed)
COVID-19 test pending. Work note provided for 72 hours to allow time for test to result. Patient encouraged to self isolate while test is pending.   Symptomatic management warranted only given symptoms are viral only.  Take ibuprofen and acetaminophen as needed for fever, body aches.  Atrovent nasal spray use as directed.  Levocetirizine as antihistamine therapy for use as directed.  And benzonatate Perles to take for cough as needed.

## 2020-07-16 NOTE — ED Provider Notes (Signed)
[facility]    CSN: [id number] Arrival date & time: 07/16/20  0917      History   Chief Complaint Chief Complaint  Patient presents with  . Sore Throat    HPI Katie Williams is a 54 y.o. female.   HPI  Encounter for COVID-19 Testing in Symptomatic Patient Recent Exposure to persons positive for COVID-19:  No  Experienced Fever:No Current Symptoms: Symptoms fatigue, congestion, sore throat, headache, facial pressure, mild cough Symptom management include: over the counter medication   Fully vaccinated against COVID-19    Past Medical History:  Diagnosis Date  . Cataract     There are no problems to display for this patient.   Past Surgical History:  Procedure Laterality Date  . COLONOSCOPY  last 01/01/2016  . no prior surgery    . POLYPECTOMY      OB History    Gravida  0   Para  0   Term  0   Preterm  0   AB  0   Living  0     SAB  0   TAB  0   Ectopic  0   Multiple  0   Live Births  0            Home Medications    Prior to Admission medications   Medication Sig Start Date End Date Taking? Authorizing Provider  hydrochlorothiazide (HYDRODIURIL) 25 MG tablet Take 25 mg by mouth daily. 09/23/19  Yes [provider]  Norethindrone Acetate-Ethinyl Estrad-FE (LOESTRIN 24 FE) 1-20 MG-MCG(24) tablet Take 1 tablet by mouth daily. 06/11/20  Yes [name], MD  estradiol (ESTRACE) 0.1 MG/GM vaginal cream Insert 1 gram twice weekly vaginally 06/15/20   [name], MD  norethindrone (MICRONOR) 0.35 MG tablet Take 1 tablet (0.35 mg total) by mouth daily. 04/08/19   [name], MD    Family History Family History  Problem Relation Age of Onset  . Colon cancer Father 54  . Hypertension Father   . Colon cancer Paternal Grandmother 14  . Colon cancer Paternal Grandfather 39  . Colon polyps Brother 35       precancerous polyps  . Hypertension Mother   . Stomach cancer Neg Hx   . Esophageal cancer  Neg Hx   . Rectal cancer Neg Hx     Social History Social History   Tobacco Use  . Smoking status: Never Smoker  . Smokeless tobacco: Never Used  Vaping Use  . Vaping Use: Never used  Substance Use Topics  . Alcohol use: Yes    Alcohol/week: 4.0 standard drinks    Types: 4 Glasses of wine per week  . Drug use: No     Allergies   Patient has no known allergies.   Review of Systems Review of Systems Pertinent negatives listed in HPI  Physical Exam Triage Vital Signs ED Triage Vitals  Enc Vitals Group     BP 07/16/20 1205 (!) 159/96     Pulse Rate 07/16/20 1205 93     Resp 07/16/20 1205 16     Temp 07/16/20 1205 98.4 F (36.9 C)     Temp Source 07/16/20 1205 Oral     SpO2 07/16/20 1205 97 %     Weight --      Height --      Head Circumference --      Peak Flow --      Pain Score 07/16/20 1222 5  Pain Loc --      Pain Edu? --      Excl. in [place]? --    No data found.  Updated Vital Signs BP (!) 159/96 (BP Location: Right Arm)   Pulse 93   Temp 98.4 F (36.9 C) (Oral)   Resp 16   SpO2 97%   Visual Acuity Right Eye Distance:   Left Eye Distance:   Bilateral Distance:    Right Eye Near:   Left Eye Near:    Bilateral Near:     Physical Exam General Appearance:    Alert, cooperative, no distress, ill appearance   HENT:   bilateral TM normal without fluid or infection, pharynx erythematous without exudate, post nasal drip noted and nasal mucosa congested  Eyes:    PERRL, conjunctiva/corneas clear, EOM's intact       Lungs:     Clear to auscultation bilaterally, respirations unlabored  Heart:    Regular rate and rhythm  Neurologic:   Awake, alert, oriented x 3. No apparent focal neurological           defect.         UC Treatments / Results  Labs (all labs ordered are listed, but only abnormal results are displayed) Labs Reviewed - No data to display  EKG   Radiology No results found.  Procedures Procedures (including critical care  time)  Medications Ordered in UC Medications - No data to display  Initial Impression / Assessment and Plan / UC Course  I have reviewed the triage vital signs and the nursing notes.  Pertinent labs & imaging results that were available during my care of the patient were reviewed by me and considered in my medical decision making (see chart for details).     COVID-19 test pending. Work note provided to allow time for test to result. Patient encouraged to self isolate while test is pending. Recommendation management of viral illness include OTC medication  management of symptoms. Patient have found the following to me helpful in symptoms improvement of viral illness. Vitamin D 5,000 IU daily, Vitamin C 500 mg twice daily, Zinc 50 mg daily. Rd flags discussed.   Final Clinical Impressions(s) / UC Diagnoses   Final diagnoses:  Suspected COVID-19 virus infection  Acute URI     Discharge Instructions      COVID-19 test pending. Work note provided for 72 hours to allow time for test to result. Patient encouraged to self isolate while test is pending.   Symptomatic management warranted only given symptoms are viral only.  Take ibuprofen and acetaminophen as needed for fever, body aches.  Atrovent nasal spray use as directed.  Levocetirizine as antihistamine therapy for use as directed.  And benzonatate Perles to take for cough as needed.    ED Prescriptions    None     PDMP not reviewed this encounter.   Adalida, Garver, FNP 07/18/20 1354

## 2020-07-18 LAB — SARS-COV-2, NAA 2 DAY TAT

## 2020-07-18 LAB — NOVEL CORONAVIRUS, NAA: SARS-CoV-2, NAA: DETECTED — AB

## 2020-07-19 ENCOUNTER — Telehealth: Payer: Self-pay | Admitting: Unknown Physician Specialty

## 2020-07-19 NOTE — Telephone Encounter (Signed)
Called to Discuss with patient about Covid symptoms and the use of the monoclonal antibody infusion for those with mild to moderate Covid symptoms and at a high risk of hospitalization.     Pt appears to qualify for this infusion due to co-morbid conditions and/or a member of an at-risk group in accordance with the FDA Emergency Use Authorization.    Unable to reach pt    

## 2020-09-25 DIAGNOSIS — R5383 Other fatigue: Secondary | ICD-10-CM | POA: Diagnosis not present

## 2020-10-29 DIAGNOSIS — Z961 Presence of intraocular lens: Secondary | ICD-10-CM | POA: Diagnosis not present

## 2020-10-29 DIAGNOSIS — H5231 Anisometropia: Secondary | ICD-10-CM | POA: Diagnosis not present

## 2020-10-29 DIAGNOSIS — H527 Unspecified disorder of refraction: Secondary | ICD-10-CM | POA: Diagnosis not present

## 2021-02-05 IMAGING — MG DIGITAL SCREENING BILAT W/ TOMO W/ CAD
8 series · 8 of 24 positions shown · non-contrast
Comparison: Previous exam(s).

CLINICAL DATA: Screening.

EXAM:
DIGITAL SCREENING BILATERAL MAMMOGRAM WITH TOMO AND CAD

[L MLO synth-2D]
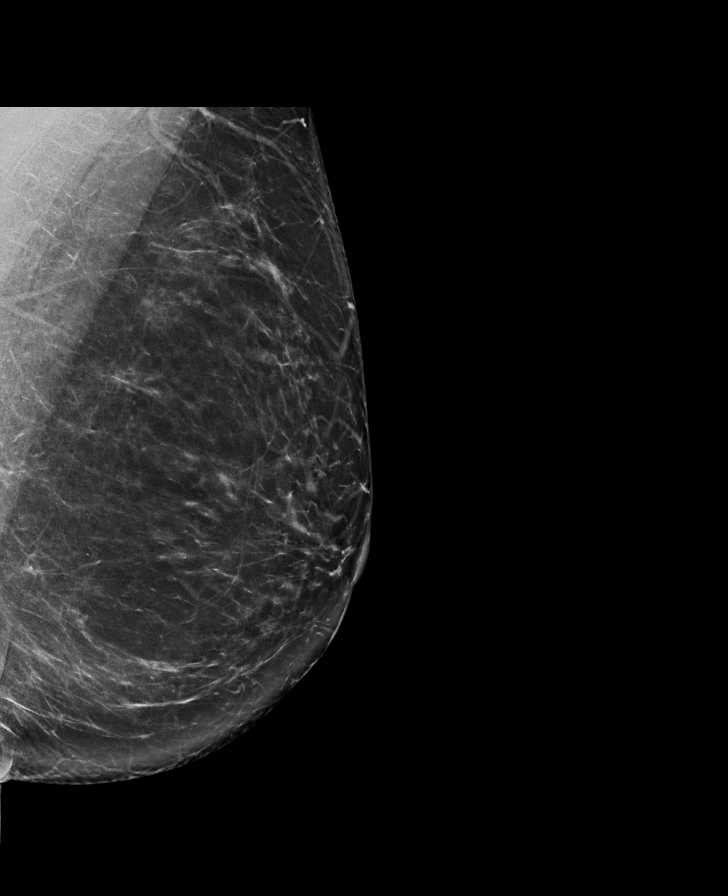

[R MLO synth-2D]
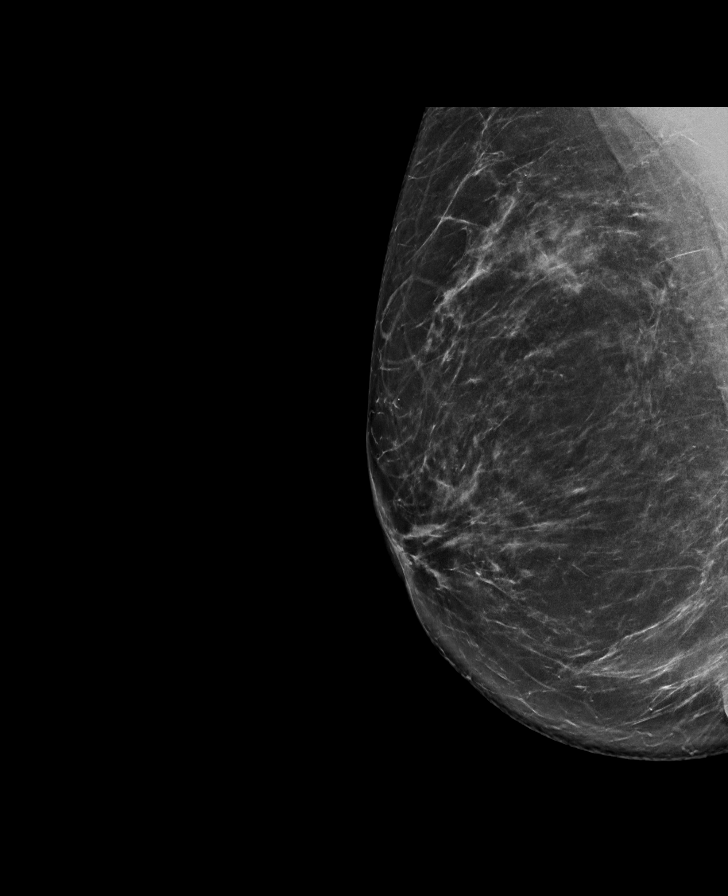

[L CC synth-2D]
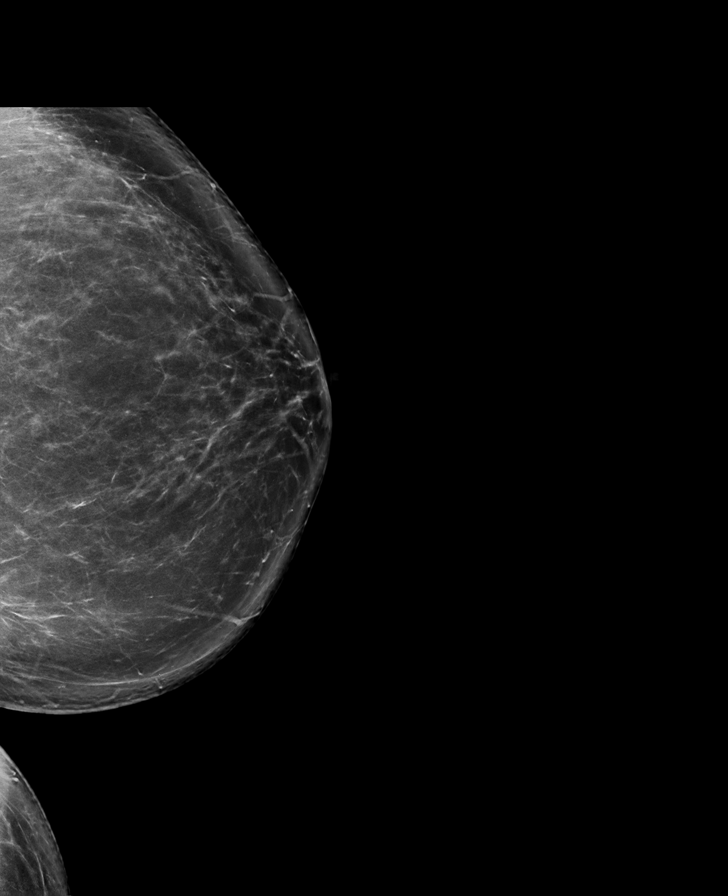

[R CC synth-2D]
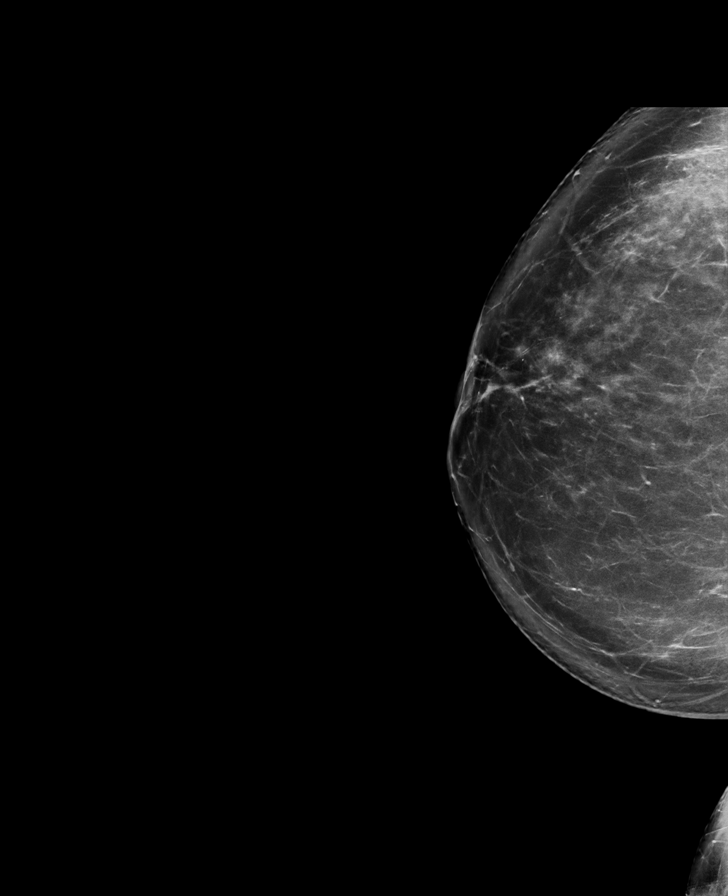

[L MLO tomo · tomo slice 47/92.0]
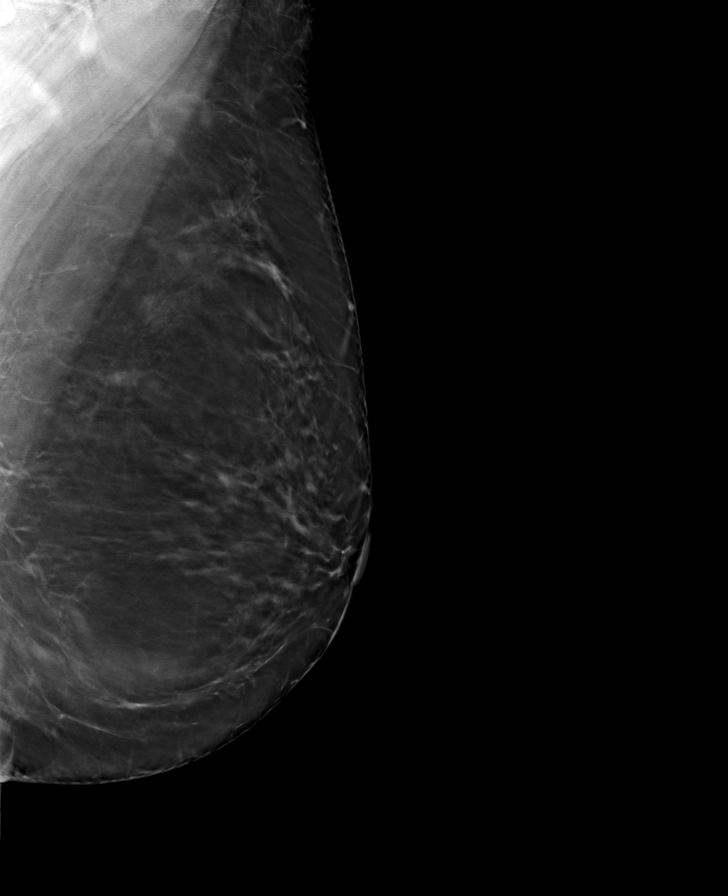

[R CC tomo · tomo slice 45/90.0]
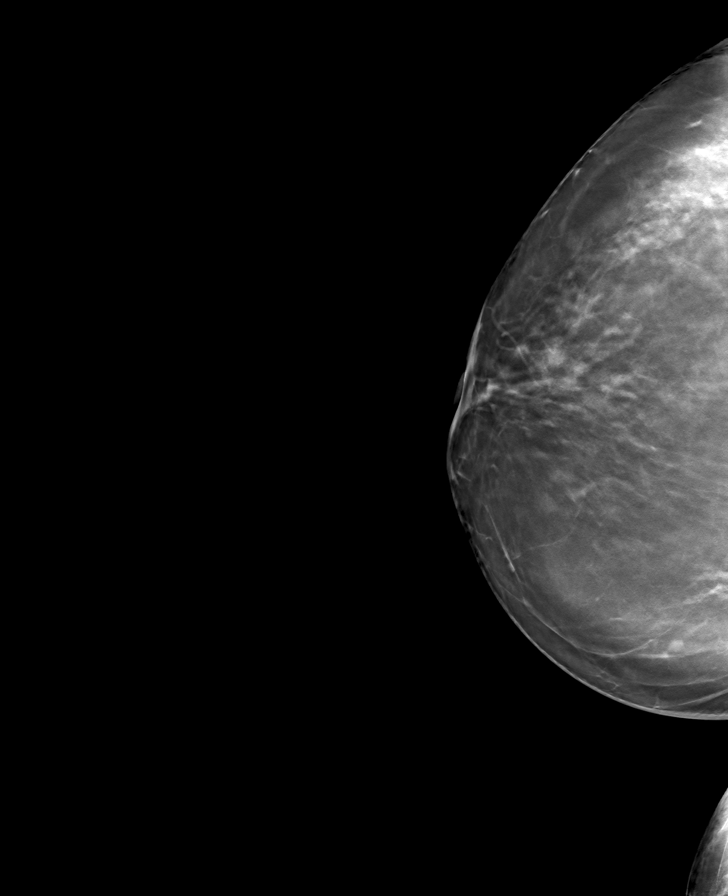

[R MLO tomo · tomo slice 45/88.0]
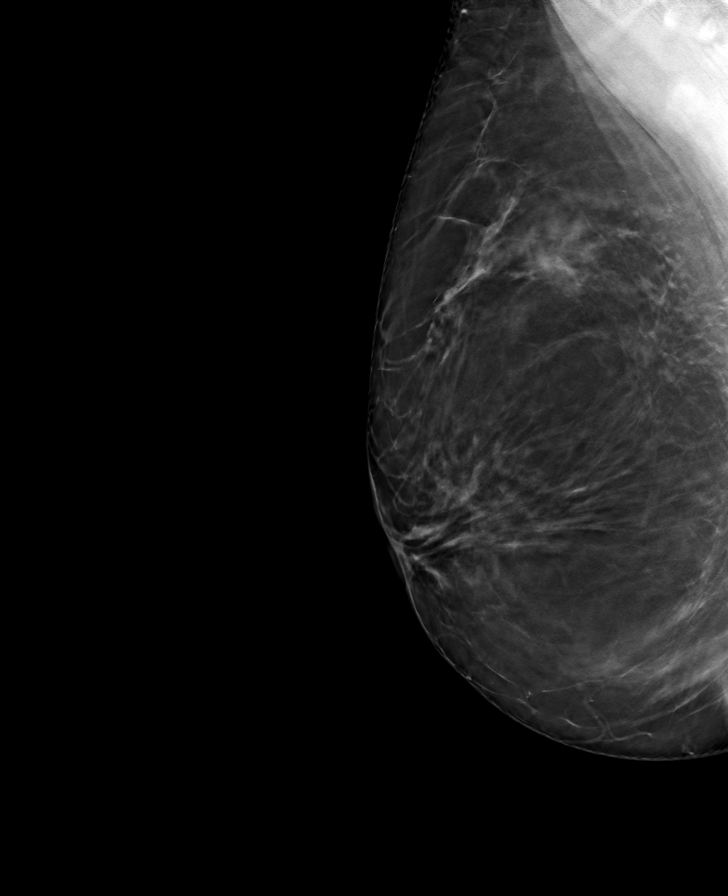

[L CC tomo · tomo slice 47/93.0]
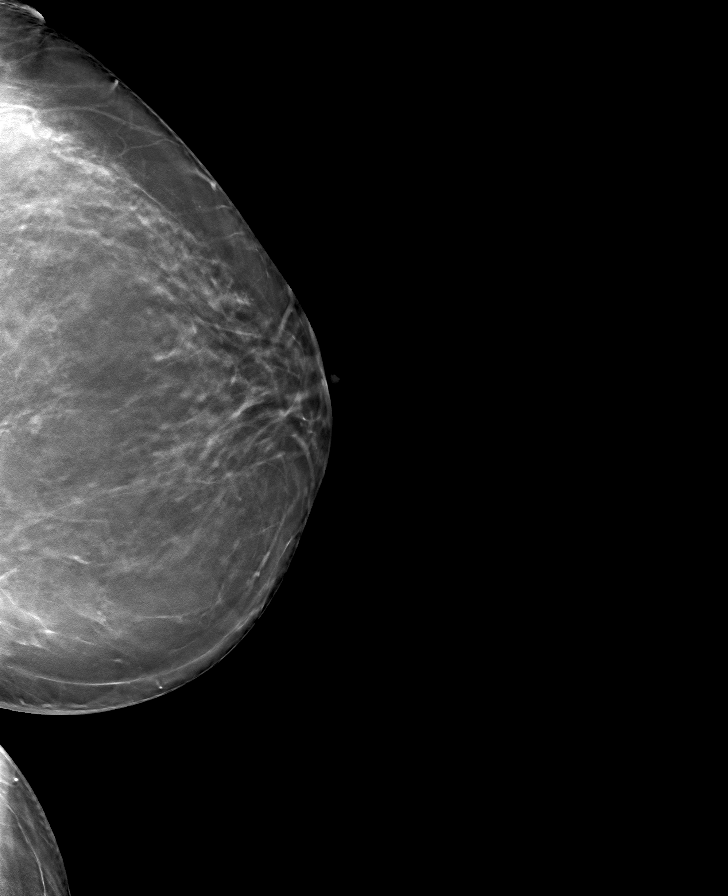

[8 of 24 positions shown; findings below may reference images not displayed]

ACR Breast Density Category b: There are scattered areas of
fibroglandular density.
FINDINGS: There are no findings suspicious for malignancy. Images were
processed with CAD.
IMPRESSION: No mammographic evidence of malignancy. A result letter of this
screening mammogram will be mailed directly to the patient.

RECOMMENDATION:
Screening mammogram in one year. (Code:[id number])

BI-RADS CATEGORY  1: Negative.

## 2021-02-08 DIAGNOSIS — I480 Paroxysmal atrial fibrillation: Secondary | ICD-10-CM

## 2021-02-08 HISTORY — DX: Paroxysmal atrial fibrillation: I48.0

## 2021-03-02 ENCOUNTER — Encounter: Payer: Self-pay | Admitting: Emergency Medicine

## 2021-03-02 ENCOUNTER — Other Ambulatory Visit: Payer: Self-pay

## 2021-03-02 ENCOUNTER — Emergency Department
Admission: EM | Admit: 2021-03-02 | Discharge: 2021-03-02 | Disposition: A | Discharge status: Home / Self Care | Payer: BC Managed Care – PPO | Source: Home / Self Care | Attending: Family Medicine | Admitting: Family Medicine

## 2021-03-02 DIAGNOSIS — R002 Palpitations: Secondary | ICD-10-CM | POA: Diagnosis not present

## 2021-03-02 DIAGNOSIS — I471 Supraventricular tachycardia: Secondary | ICD-10-CM

## 2021-03-02 NOTE — Discharge Instructions (Signed)
Tachycardia can be caused by a variety of medical problems or conditions Drink plenty of water Avoid caffeine Call your doctor on Monday to see about getting a heart monitor I have ordered blood work to check for thyroid disease, diabetes, other medical issues.  This should be available to your doctor by Monday  You can check your test results on MyChart  Go to the emergency room if you have atrial fibrillation or chest pain/pressure again

## 2021-03-02 NOTE — ED Provider Notes (Signed)
[name]    CSN: [id number] Arrival date & time: 03/02/21  1019      History   Chief Complaint Chief Complaint  Patient presents with  . Tachycardia    HPI Katie Williams is a 55 y.o. female.    HPI   Patient states that she was on a business trip in [place].  She states that it dropped stressful, but the business trip was not any worse than her usual day.  She states that she went to bed and then was awakened in the middle of the night with a heavy pressure sensation in her chest.  She states that her heart was beating extremely fast.  She felt tingling in her lips and in her arms.  She states that she checked her watch, that has a vital signs application and it told her she was in atrial fibrillation.  States her heart rate was over 150.  States that this chest pressure and rapid heart rate lasted for about 30 minutes.  She finally was able to go back to sleep.  She is here today, as soon as she got home from her business trip, to be evaluated.  She has never had any chest pain or heart disease before.  She is under treatment for hypertension.  She states that she does not have diabetes or hyperlipidemia.  Non-smoker.  Drinks alcohol in moderation.  She states both her father and grandfather have had heart disease, but it was over the age of 53.  Past Medical History:  Diagnosis Date  . Cataract     There are no problems to display for this patient.   Past Surgical History:  Procedure Laterality Date  . COLONOSCOPY  last 01/01/2016  . no prior surgery    . POLYPECTOMY      OB History    Gravida  0   Para  0   Term  0   Preterm  0   AB  0   Living  0     SAB  0   IAB  0   Ectopic  0   Multiple  0   Live Births  0            Home Medications    Prior to Admission medications   Medication Sig Start Date End Date Taking? Authorizing Provider  estradiol (ESTRACE) 0.1 MG/GM vaginal cream Insert 1 gram twice weekly vaginally  06/15/20  Yes [name], MD  hydrochlorothiazide (HYDRODIURIL) 25 MG tablet Take 25 mg by mouth daily. 09/23/19  Yes [provider]  Norethindrone Acetate-Ethinyl Estrad-FE (LOESTRIN 24 FE) 1-20 MG-MCG(24) tablet Take 1 tablet by mouth daily. 06/11/20  Yes [name], MD  benzonatate (TESSALON) 100 MG capsule Take 1 capsule (100 mg total) by mouth every 8 (eight) hours. 07/16/20   Scot Jun, FNP  ipratropium (ATROVENT) 0.03 % nasal spray Place 2 sprays into both nostrils 2 (two) times daily. 07/16/20   Scot Jun, FNP  levocetirizine (XYZAL) 5 MG tablet Take 1 tablet (5 mg total) by mouth every evening. 07/16/20   Scot Jun, FNP  norethindrone (MICRONOR) 0.35 MG tablet Take 1 tablet (0.35 mg total) by mouth daily. 04/08/19   [name], MD    Family History Family History  Problem Relation Age of Onset  . Colon cancer Father 60  . Hypertension Father   . Colon cancer Paternal Grandmother 81  . Colon cancer Paternal Grandfather 66  . Colon  polyps Brother 35       precancerous polyps  . Hypertension Mother   . Stomach cancer Neg Hx   . Esophageal cancer Neg Hx   . Rectal cancer Neg Hx     Social History Social History   Tobacco Use  . Smoking status: Never Smoker  . Smokeless tobacco: Never Used  Vaping Use  . Vaping Use: Never used  Substance Use Topics  . Alcohol use: Yes    Alcohol/week: 4.0 standard drinks    Types: 4 Glasses of wine per week  . Drug use: No     Allergies   Patient has no known allergies.   Review of Systems Review of Systems See HPI  Physical Exam Triage Vital Signs ED Triage Vitals  Enc Vitals Group     BP 03/02/21 1042 129/90     Pulse Rate 03/02/21 1042 (!) 106     Resp 03/02/21 1042 20     Temp 03/02/21 1042 98.9 F (37.2 C)     Temp Source 03/02/21 1042 Oral     SpO2 03/02/21 1042 98 %     Weight --      Height --      Head Circumference --      Peak Flow --      Pain Score  03/02/21 1044 0     Pain Loc --      Pain Edu? --      Excl. in [place]? --    No data found.  Updated Vital Signs BP 129/90 (BP Location: Right Arm)   Pulse (!) 106   Temp 98.9 F (37.2 C) (Oral)   Resp 20   SpO2 98%     Physical Exam Vitals and nursing note reviewed.  Constitutional:      General: She is not in acute distress.    Appearance: Normal appearance. She is well-developed and normal weight. She is not diaphoretic.  HENT:     Head: Normocephalic and atraumatic.     Nose:     Comments: mask Eyes:     Conjunctiva/sclera: Conjunctivae normal.     Pupils: Pupils are equal, round, and reactive to light.  Neck:     Vascular: No carotid bruit.  Cardiovascular:     Rate and Rhythm: Regular rhythm. Tachycardia present.     Heart sounds: Normal heart sounds. No murmur heard.   Pulmonary:     Effort: Pulmonary effort is normal. No respiratory distress.     Breath sounds: Normal breath sounds. No rales.  Abdominal:     General: Abdomen is flat. There is no distension.     Palpations: Abdomen is soft.  Musculoskeletal:        General: Normal range of motion.     Cervical back: Normal range of motion and neck supple.  Skin:    General: Skin is warm and dry.  Neurological:     Mental Status: She is alert.  Psychiatric:        Behavior: Behavior normal.      UC Treatments / Results  Labs (all labs ordered are listed, but only abnormal results are displayed) Labs Reviewed  CBC WITH DIFFERENTIAL/PLATELET  COMPLETE METABOLIC PANEL WITH GFR  TSH    EKG   Radiology No results found.  Procedures ED EKG  Date/Time: 03/02/2021 11:53 AM Performed by: Raylene Everts, MD Authorized by: Raylene Everts, MD   ECG reviewed by ED Physician in the absence of a cardiologist: yes  Previous ECG:    Previous ECG:  Unavailable Interpretation:    Interpretation: abnormal     Details:  Tachycardia.  Right axis Rate:    ECG rate:  10 8   ECG rate assessment:  tachycardic   Rhythm:    Rhythm: sinus rhythm   Ectopy:    Ectopy: none   QRS:    QRS axis:  Right ST segments:    ST segments:  Normal T waves:    T waves: normal   Q waves:    Abnormal Q-waves: not present     (including critical care time)  Medications Ordered in UC Medications - No data to display  Initial Impression / Assessment and Plan / UC Course  I have reviewed the triage vital signs and the nursing notes.  Pertinent labs & imaging results that were available during my care of the patient were reviewed by me and considered in my medical decision making (see chart for details).     Discussed with patient tachycardia.  His tachycardia is not an indicator of heart disease by itself but can be from a number of underlying conditions.  Exercise, fever, dehydration, caffeine, medications, thyroid.  Counseled her to rest and drink plenty of fluids this weekend.  Avoid caffeine.  We will do some preliminary blood work.  She needs to follow-up with her PCP to get a Holter monitor.  I do not see any evidence of strain on her heart indicating heart attack, no evidence of atrial fibrillation at this time.  She is advised to go to the emergency room immediately if she has recurrence of her chest pressure/rapid heartbeat spell.  I told her it would not be unreasonable to dial 911 Final Clinical Impressions(s) / UC Diagnoses   Final diagnoses:  Heart palpitations  SVT (supraventricular tachycardia) (HCC)     Discharge Instructions     Tachycardia can be caused by a variety of medical problems or conditions Drink plenty of water Avoid caffeine Call your doctor on Monday to see about getting a heart monitor I have ordered blood work to check for thyroid disease, diabetes, other medical issues.  This should be available to your doctor by Monday  You can check your test results on MyChart  Go to the emergency room if you have atrial fibrillation or chest pain/pressure again   ED  Prescriptions    None     PDMP not reviewed this encounter.   Raylene Everts, MD 03/02/21 1155

## 2021-03-02 NOTE — ED Triage Notes (Signed)
Patient states that she was out of town, early Friday morning, she experienced heavy chest pain, tingling in lips and arms, fast heart rate.  Patient took an EKG on her watch which indicated she was in A-Fib.  No SOB, no blurred vision.  Patient was able to take deep breaths to get through it.

## 2021-03-03 LAB — COMPLETE METABOLIC PANEL WITH GFR
AG Ratio: 1.8 (calc) (ref 1.0–2.5)
ALT: 19 U/L (ref 6–29)
AST: 27 U/L (ref 10–35)
Albumin: 4.3 g/dL (ref 3.6–5.1)
Alkaline phosphatase (APISO): 67 U/L (ref 37–153)
BUN/Creatinine Ratio: 14 (calc) (ref 6–22)
BUN: 18 mg/dL (ref 7–25)
CO2: 21 mmol/L (ref 20–32)
Calcium: 10.3 mg/dL (ref 8.6–10.4)
Chloride: 104 mmol/L (ref 98–110)
Creat: 1.29 mg/dL — ABNORMAL HIGH (ref 0.50–1.05)
GFR, Est African American: 55 mL/min/{1.73_m2} — ABNORMAL LOW (ref >=60)
GFR, Est Non African American: 48 mL/min/{1.73_m2} — ABNORMAL LOW (ref >=60)
Globulin: 2.4 g/dL (calc) (ref 1.9–3.7)
Glucose, Bld: 100 mg/dL — ABNORMAL HIGH (ref 65–99)
Potassium: 4.4 mmol/L (ref 3.5–5.3)
Sodium: 137 mmol/L (ref 135–146)
Total Bilirubin: 0.9 mg/dL (ref 0.2–1.2)
Total Protein: 6.7 g/dL (ref 6.1–8.1)

## 2021-03-03 LAB — CBC WITH DIFFERENTIAL/PLATELET
Absolute Monocytes: 525 cells/uL (ref 200–950)
Basophils Absolute: 89 cells/uL (ref 0–200)
Basophils Relative: 1 %
Eosinophils Absolute: 125 cells/uL (ref 15–500)
Eosinophils Relative: 1.4 %
HCT: 47.3 % — ABNORMAL HIGH (ref 35.0–45.0)
Hemoglobin: 15.5 g/dL (ref 11.7–15.5)
Lymphs Abs: 1922 cells/uL (ref 850–3900)
MCH: 33.8 pg — ABNORMAL HIGH (ref 27.0–33.0)
MCHC: 32.8 g/dL (ref 32.0–36.0)
MCV: 103.1 fL — ABNORMAL HIGH (ref 80.0–100.0)
MPV: 11.3 fL (ref 7.5–12.5)
Monocytes Relative: 5.9 %
Neutro Abs: 6239 cells/uL (ref 1500–7800)
Neutrophils Relative %: 70.1 %
Platelets: 269 10*3/uL (ref 140–400)
RBC: 4.59 10*6/uL (ref 3.80–5.10)
RDW: 11.8 % (ref 11.0–15.0)
Total Lymphocyte: 21.6 %
WBC: 8.9 10*3/uL (ref 3.8–10.8)

## 2021-03-03 LAB — TSH: TSH: 1.69 mIU/L

## 2021-03-04 ENCOUNTER — Other Ambulatory Visit: Payer: Self-pay | Admitting: Family Medicine

## 2021-03-04 DIAGNOSIS — I4891 Unspecified atrial fibrillation: Secondary | ICD-10-CM

## 2021-03-04 HISTORY — DX: Unspecified atrial fibrillation: I48.91

## 2021-03-04 NOTE — Progress Notes (Signed)
Single event of Afib w/ RVR. FMHx of CHF

## 2021-04-01 ENCOUNTER — Other Ambulatory Visit: Payer: Self-pay

## 2021-04-01 ENCOUNTER — Ambulatory Visit (HOSPITAL_BASED_OUTPATIENT_CLINIC_OR_DEPARTMENT_OTHER)
Admission: RE | Admit: 2021-04-01 | Discharge: 2021-04-01 | Disposition: A | Discharge status: Home / Self Care | Payer: 59 | Source: Ambulatory Visit | Attending: Family Medicine | Admitting: Family Medicine

## 2021-04-01 DIAGNOSIS — I4891 Unspecified atrial fibrillation: Secondary | ICD-10-CM | POA: Diagnosis not present

## 2021-04-01 LAB — ECHOCARDIOGRAM COMPLETE
AR max vel: 1.98 cm2
AV Area VTI: 2.13 cm2
AV Area mean vel: 1.94 cm2
AV Mean grad: 4 mmHg
AV Peak grad: 8.2 mmHg
Ao pk vel: 1.43 m/s
Area-P 1/2: 5.13 cm2
Calc EF: 74.7 %
S' Lateral: 2.07 cm
Single Plane A2C EF: 80.3 %
Single Plane A4C EF: 67 %

## 2021-04-18 ENCOUNTER — Other Ambulatory Visit (HOSPITAL_COMMUNITY): Payer: 59

## 2021-05-21 ENCOUNTER — Other Ambulatory Visit: Payer: Self-pay | Admitting: Obstetrics & Gynecology

## 2021-05-21 DIAGNOSIS — Z1231 Encounter for screening mammogram for malignant neoplasm of breast: Secondary | ICD-10-CM

## 2021-06-13 ENCOUNTER — Ambulatory Visit: Payer: 59 | Admitting: Gastroenterology

## 2021-06-13 ENCOUNTER — Encounter: Payer: Self-pay | Admitting: Gastroenterology

## 2021-06-13 VITALS — BP 110/80 | HR 86 | Ht 69.0 in | Wt 194.0 lb

## 2021-06-13 DIAGNOSIS — K219 Gastro-esophageal reflux disease without esophagitis: Secondary | ICD-10-CM

## 2021-06-13 DIAGNOSIS — R059 Cough, unspecified: Secondary | ICD-10-CM | POA: Diagnosis not present

## 2021-06-13 MED ORDER — PANTOPRAZOLE SODIUM 40 MG PO TBEC: 40.0000 mg | DELAYED_RELEASE_TABLET | Freq: Every day | ORAL | 11 refills | Status: DC

## 2021-06-13 NOTE — Patient Instructions (Signed)
We have sent the following medications to your pharmacy for you to pick up at your convenience: pantoprazole.  You can still take famotidine 40 mg twice daily as needed.   Patient advised to avoid spicy, acidic, citrus, chocolate, mints, fruit and fruit juices.  Limit the intake of caffeine, alcohol and Soda.  Don't exercise too soon after eating.  Don't lie down within 3-4 hours of eating.  Elevate the head of your bed.  You have been scheduled for an endoscopy. Please follow written instructions given to you at your visit today. If you use inhalers (even only as needed), please bring them with you on the day of your procedure.  Due to recent changes in healthcare laws, you may see the results of your imaging and laboratory studies on MyChart before your provider has had a chance to review them.  We understand that in some cases there may be results that are confusing or concerning to you. Not all laboratory results come back in the same time frame and the provider may be waiting for multiple results in order to interpret others.  Please give [place] 48 hours in order for your provider to thoroughly review all the results before contacting the office for clarification of your results.   The [facility] providers would like to encourage you to use [facility] to communicate with providers for non-urgent requests or questions.  Due to long hold times on the telephone, sending your provider a message by [facility] may be a faster and more efficient way to get a response.  Please allow 48 business hours for a response.  Please remember that this is for non-urgent requests.   Normal BMI (Body Mass Index- based on height and weight) is between 19 and 25. Your BMI today is Body mass index is 28.65 kg/m. Marland Kitchen Please consider follow up  regarding your BMI with your Primary Care Provider.  Thank you for choosing me and [facility].  Pricilla Riffle. Dagoberto Ligas., MD., Marval Regal

## 2021-06-13 NOTE — Progress Notes (Signed)
    History of Present Illness: This is a 55 year old female who relates recurrent problems with a dry cough and intermittent heartburn.  She states she has been treated for reflux with famotidine twice daily and when she maintains this medication her symptoms are under better control however she still has breakthrough symptoms.  If she discontinues medication her symptoms clearly worsen.  She relates a couple episodes of solid food dysphagia with larger bites of food over the past couple years. Denies weight loss, abdominal pain, constipation, diarrhea, change in stool caliber, melena, hematochezia, nausea, vomiting, chest pain.   Current Medications, Allergies, Past Medical History, Past Surgical History, Family History and Social History were reviewed in [employer] record.   Physical Exam: General: Well developed, well nourished, no acute distress Head: Normocephalic and atraumatic Eyes: Sclerae anicteric, EOMI Ears: Normal auditory acuity Mouth: Not examined, mask on during Covid-19 pandemic Lungs: Clear throughout to auscultation Heart: Regular rate and rhythm; no murmurs, rubs or bruits Abdomen: Soft, non tender and non distended. No masses, hepatosplenomegaly or hernias noted. Normal Bowel sounds Rectal: Not done  Musculoskeletal: Symmetrical with no gross deformities  Pulses:  Normal pulses noted Extremities: No clubbing, cyanosis, edema or deformities noted Neurological: Alert oriented x 4, grossly nonfocal Psychological:  Alert and cooperative. Normal mood and affect   Assessment and Recommendations:  GERD with cough, suspected LPR.  A couple episodes of solid food dysphagia.  Rule out esophagitis, stricture.  Begin pantoprazole 40 mg daily.  Closely follow antireflux measures.  Change famotidine to 20 mg p.o. twice daily as needed for breakthrough symptoms.  Tums prn for breakthrough symptoms.  Schedule EGD, possible dilation. The risks (including  bleeding, perforation, infection, missed lesions, medication reactions and possible hospitalization or surgery if complications occur), benefits, and alternatives to endoscopy with possible biopsy and possible dilation were discussed with the patient and they consent to proceed.   Personal history of adenomatous colon polyps.  Family history of colon cancer, multiple second-degree relatives before age 53.  Family history of colon polyps in her brother at age 69.  Surveillance colonoscopy recommended in March 2025.

## 2021-06-18 ENCOUNTER — Ambulatory Visit (AMBULATORY_SURGERY_CENTER): Payer: 59 | Admitting: Gastroenterology

## 2021-06-18 ENCOUNTER — Other Ambulatory Visit: Payer: Self-pay

## 2021-06-18 ENCOUNTER — Encounter: Payer: Self-pay | Admitting: Gastroenterology

## 2021-06-18 VITALS — BP 119/70 | HR 71 | Temp 97.8°F | Resp 11 | Ht 69.0 in | Wt 194.0 lb

## 2021-06-18 DIAGNOSIS — K2951 Unspecified chronic gastritis with bleeding: Secondary | ICD-10-CM

## 2021-06-18 DIAGNOSIS — K319 Disease of stomach and duodenum, unspecified: Secondary | ICD-10-CM | POA: Diagnosis not present

## 2021-06-18 DIAGNOSIS — K2970 Gastritis, unspecified, without bleeding: Secondary | ICD-10-CM | POA: Diagnosis not present

## 2021-06-18 DIAGNOSIS — K2100 Gastro-esophageal reflux disease with esophagitis, without bleeding: Secondary | ICD-10-CM

## 2021-06-18 DIAGNOSIS — K209 Esophagitis, unspecified without bleeding: Secondary | ICD-10-CM

## 2021-06-18 DIAGNOSIS — K449 Diaphragmatic hernia without obstruction or gangrene: Secondary | ICD-10-CM | POA: Diagnosis not present

## 2021-06-18 DIAGNOSIS — K2101 Gastro-esophageal reflux disease with esophagitis, with bleeding: Secondary | ICD-10-CM

## 2021-06-18 DIAGNOSIS — R1310 Dysphagia, unspecified: Secondary | ICD-10-CM

## 2021-06-18 DIAGNOSIS — K219 Gastro-esophageal reflux disease without esophagitis: Secondary | ICD-10-CM

## 2021-06-18 MED ORDER — PANTOPRAZOLE SODIUM 40 MG PO TBEC
40.0000 mg | DELAYED_RELEASE_TABLET | Freq: Every day | ORAL | 11 refills | Status: DC
Start: 2021-06-18 — End: 2021-09-17

## 2021-06-18 MED ORDER — SODIUM CHLORIDE 0.9 % IV SOLN: 500.0000 mL | Freq: Once | INTRAVENOUS | Status: DC

## 2021-06-18 MED ORDER — PANTOPRAZOLE SODIUM 40 MG PO TBEC: 40.0000 mg | DELAYED_RELEASE_TABLET | Freq: Every day | ORAL | 3 refills | Status: DC

## 2021-06-18 NOTE — Progress Notes (Signed)
To PACU, VSS. Report to [email or web address] 

## 2021-06-18 NOTE — Progress Notes (Signed)
A.S. VITAL SIGNS.

## 2021-06-18 NOTE — Progress Notes (Signed)
Called to room to assist during endoscopic procedure.  Patient ID and intended procedure confirmed with present staff. Received instructions for my participation in the procedure from the performing physician.  

## 2021-06-18 NOTE — Op Note (Signed)
[facility] Patient Name: Katie Williams Procedure Date: 06/18/2021 9:36 AM MRN: [id number] Endoscopist: Ladene Artist , MD Age: 55 Referring MD:  Date of Birth: [birth year] Gender: Female Account #: [id number] Procedure:                Upper GI endoscopy Indications:              Dysphagia, Gastroesophageal reflux disease Medicines:                Monitored Anesthesia Care Procedure:                Pre-Anesthesia Assessment:                           - Prior to the procedure, a History and Physical                            was performed, and patient medications and                            allergies were reviewed. The patient's tolerance of                            previous anesthesia was also reviewed. The risks                            and benefits of the procedure and the sedation                            options and risks were discussed with the patient.                            All questions were answered, and informed consent                            was obtained. Prior Anticoagulants: The patient has                            taken no previous anticoagulant or antiplatelet                            agents. ASA Grade Assessment: II - A patient with                            mild systemic disease. After reviewing the risks                            and benefits, the patient was deemed in                            satisfactory condition to undergo the procedure.                           After obtaining informed consent, the endoscope was  passed under direct vision. Throughout the                            procedure, the patient's blood pressure, pulse, and                            oxygen saturations were monitored continuously. The                            GIF [id number] was introduced through the                            mouth, and advanced to the second part of duodenum.                            The upper GI  endoscopy was accomplished without                            difficulty. The patient tolerated the procedure                            well. Scope In: Scope Out: Findings:                 LA Grade B (one or more mucosal breaks greater than                            5 mm, not extending between the tops of two mucosal                            folds) esophagitis with no bleeding was found at                            the gastroesophageal junction. Erythema extending 2                            cm above EGJ which was at 35 cm, R/O esophagitis vs                            Barrett's. Biopsies were taken with a cold forceps                            for histology.                           No endoscopic abnormality was evident in the                            esophagus to explain the patient's complaint of                            dysphagia. It was decided, however, to proceed with  dilation in the distal esophagus. A guidewire was                            placed and the scope was withdrawn. Dilation was                            performed with a Savary dilator with no resistance                            at 18 mm.                           The exam of the esophagus was otherwise normal.                           Patchy mildly erythematous mucosa without bleeding                            was found in the gastric body and in the prepyloric                            region of the stomach. Biopsies were taken with a                            cold forceps for histology.                           A small hiatal hernia was present.                           The exam of the stomach was otherwise normal.                           The duodenal bulb and second portion of the                            duodenum were normal. Complications:            No immediate complications. Estimated Blood Loss:     Estimated blood loss was minimal. Impression:                - LA Grade B reflux esophagitis with no bleeding.                            Biopsied.                           - No endoscopic esophageal abnormality to explain                            patient's dysphagia. Esophagus dilated.                           - Erythematous mucosa in the gastric body and  prepyloric region of the stomach. Biopsied.                           - Small hiatal hernia.                           - Normal duodenal bulb and second portion of the                            duodenum. Recommendation:           - Patient has a contact number available for                            emergencies. The signs and symptoms of potential                            delayed complications were discussed with the                            patient. Return to normal activities tomorrow.                            Written discharge instructions were provided to the                            patient.                           - Clear liquid diet for 2 hours, then advance as                            tolerated to soft diet today.                           - Resume prior diet tomrrow.                           - Antireflux measures long term.                           - Continue present medications and change                            pantoprazole 40 mg po qd precription to 90 days                            with 3 refills.                           - Await pathology results.                           - Return to GI office in 6 weeks. Ladene Artist, MD 06/18/2021 10:07:53 AM This report has been signed electronically.

## 2021-06-18 NOTE — Progress Notes (Signed)
Pt's states no medical or surgical changes since previsit or office visit. 

## 2021-06-18 NOTE — Patient Instructions (Signed)
Handouts given:  Post dilation diet, esophagitis, Hiatal hernia Clear liquid diet for 2 hours then soft for the rest of today Resume prior diet tomorrow Maintain antireflux measure long term.  Await pathology results Please return to GI office in 6 weeks  YOU HAD AN ENDOSCOPIC PROCEDURE TODAY AT [facility]:   Refer to the procedure report that was given to you for any specific questions about what was found during the examination.  If the procedure report does not answer your questions, please call your gastroenterologist to clarify.  If you requested that your care partner not be given the details of your procedure findings, then the procedure report has been included in a sealed envelope for you to review at your convenience later.  YOU SHOULD EXPECT: Some feelings of bloating in the abdomen. Passage of more gas than usual.  Walking can help get rid of the air that was put into your GI tract during the procedure and reduce the bloating. If you had a lower endoscopy (such as a colonoscopy or flexible sigmoidoscopy) you may notice spotting of blood in your stool or on the toilet paper. If you underwent a bowel prep for your procedure, you may not have a normal bowel movement for a few days.  Please Note:  You might notice some irritation and congestion in your nose or some drainage.  This is from the oxygen used during your procedure.  There is no need for concern and it should clear up in a day or so.  SYMPTOMS TO REPORT IMMEDIATELY:  Following upper endoscopy (EGD)  Vomiting of blood or coffee ground material  New chest pain or pain under the shoulder blades  Painful or persistently difficult swallowing  New shortness of breath  Fever of 100F or higher  Black, tarry-looking stools  For urgent or emergent issues, a gastroenterologist can be reached at any hour by calling [phone]. Do not use MyChart messaging for urgent concerns.   DIET:  We do recommend a small  meal at first, but then you may proceed to your regular diet.  Drink plenty of fluids but you should avoid alcoholic beverages for 24 hours.  ACTIVITY:  You should plan to take it easy for the rest of today and you should NOT DRIVE or use heavy machinery until tomorrow (because of the sedation medicines used during the test).    FOLLOW UP: Our staff will call the number listed on your records 48-72 hours following your procedure to check on you and address any questions or concerns that you may have regarding the information given to you following your procedure. If we do not reach you, we will leave a message.  We will attempt to reach you two times.  During this call, we will ask if you have developed any symptoms of COVID 19. If you develop any symptoms (ie: fever, flu-like symptoms, shortness of breath, cough etc.) before then, please call [phone].  If you test positive for Covid 19 in the 2 weeks post procedure, please call and report this information to [place].    If any biopsies were taken you will be contacted by phone or by letter within the next 1-3 weeks.  Please call us at [phone] if you have not heard about the biopsies in 3 weeks.   SIGNATURES/CONFIDENTIALITY: You and/or your care partner have signed paperwork which will be entered into your electronic medical record.  These signatures attest to the fact that that the information above  on your After Visit Summary has been reviewed and is understood.  Full responsibility of the confidentiality of this discharge information lies with you and/or your care-partner.

## 2021-06-20 ENCOUNTER — Telehealth: Payer: Self-pay | Admitting: *Deleted

## 2021-06-20 NOTE — Telephone Encounter (Signed)
  Follow up Call-  Call back number 06/18/2021 01/28/2019  Post procedure Call Back phone  # [phone]  Permission to leave phone message Yes Yes  Some recent data might be hidden     Patient questions: No. Do you have a fever, pain , or abdominal swelling? No. Pain Score  0 *  Have you tolerated food without any problems? Yes.    Have you been able to return to your normal activities? Yes.    Do you have any questions about your discharge instructions: Diet   No. Medications  No. Follow up visit  No.  Do you have questions or concerns about your Care? Yes.    Actions: * If pain score is 4 or above: No action needed, pain <4.  Have you developed a fever since your procedure? no  2.   Have you had an respiratory symptoms (SOB or cough) since your procedure? no  3.   Have you tested positive for COVID 19 since your procedure no  4.   Have you had any family members/close contacts diagnosed with the COVID 19 since your procedure?  no   If yes to any of these questions please route to Joylene John, RN and Joella Prince, RN

## 2021-07-04 ENCOUNTER — Encounter: Payer: Self-pay | Admitting: Gastroenterology

## 2021-07-12 ENCOUNTER — Ambulatory Visit
Admission: RE | Admit: 2021-07-12 | Discharge: 2021-07-12 | Disposition: A | Discharge status: Home / Self Care | Payer: 59 | Source: Ambulatory Visit | Attending: Obstetrics & Gynecology | Admitting: Obstetrics & Gynecology

## 2021-07-12 ENCOUNTER — Other Ambulatory Visit: Payer: Self-pay

## 2021-07-12 DIAGNOSIS — Z1231 Encounter for screening mammogram for malignant neoplasm of breast: Secondary | ICD-10-CM

## 2021-08-08 ENCOUNTER — Other Ambulatory Visit: Payer: Self-pay | Admitting: Obstetrics & Gynecology

## 2021-08-20 ENCOUNTER — Telehealth: Payer: Self-pay

## 2021-08-20 MED ORDER — NORETHIN ACE-ETH ESTRAD-FE 1-20 MG-MCG(24) PO TABS
1.0000 | ORAL_TABLET | Freq: Every day | ORAL | 0 refills | Status: DC
Start: 2021-08-20 — End: 2021-10-21

## 2021-08-20 NOTE — Telephone Encounter (Signed)
Last AEX 06/10/20 Scheduled fro 10/21/21.

## 2021-08-20 NOTE — Telephone Encounter (Signed)
Patient called requesting refill on bc pill.  Last AEX 06/10/20  Past due for AEX. Message sent to appt desk to call patient and arrange AEX prior to refilling bcp.

## 2021-09-12 ENCOUNTER — Telehealth (HOSPITAL_COMMUNITY): Payer: Self-pay | Admitting: Physician Assistant

## 2021-09-12 NOTE — Telephone Encounter (Signed)
Referral received from Dr. Linna Darner for patient.  Called and spoke with patient.  Patient states she is driving.  She states she will call back when she gets back to her office and can look at her calendar.

## 2021-09-17 ENCOUNTER — Ambulatory Visit (HOSPITAL_COMMUNITY)
Admission: RE | Admit: 2021-09-17 | Discharge: 2021-09-17 | Disposition: A | Discharge status: Home / Self Care | Payer: 59 | Source: Ambulatory Visit | Attending: Physician Assistant | Admitting: Physician Assistant

## 2021-09-17 ENCOUNTER — Encounter (HOSPITAL_COMMUNITY): Payer: Self-pay | Admitting: Physician Assistant

## 2021-09-17 VITALS — BP 136/92 | HR 101 | Ht 69.0 in | Wt 200.6 lb

## 2021-09-17 DIAGNOSIS — R400 Somnolence: Secondary | ICD-10-CM | POA: Insufficient documentation

## 2021-09-17 DIAGNOSIS — I10 Essential (primary) hypertension: Secondary | ICD-10-CM | POA: Insufficient documentation

## 2021-09-17 DIAGNOSIS — I480 Paroxysmal atrial fibrillation: Secondary | ICD-10-CM | POA: Insufficient documentation

## 2021-09-17 DIAGNOSIS — Z8616 Personal history of COVID-19: Secondary | ICD-10-CM | POA: Diagnosis not present

## 2021-09-17 DIAGNOSIS — R0683 Snoring: Secondary | ICD-10-CM | POA: Diagnosis not present

## 2021-09-17 MED ORDER — METOPROLOL SUCCINATE ER 25 MG PO TB24: 25.0000 mg | ORAL_TABLET | Freq: Every day | ORAL | 11 refills | Status: DC

## 2021-09-17 NOTE — Progress Notes (Signed)
Primary Care Physician: Waldemar Dickens, MD Primary Cardiologist: none Primary Electrophysiologist: none Referring Physician: Dr Vito Berger Katie Williams is a 55 y.o. female with a history of ascending aorta dilatation, HTN, atrial fibrillation who presents for consultation in the [facility].  The patient was initially diagnosed with atrial fibrillation on her smart watch with symptoms of tachypalpitations and chest discomfort on 03/01/21. The episode resolved by the time she sought care the following day. Patient has a CHADS2VASC score of 2. She brings in her Apple Watch strips today which do show true afib (personally reviewed). She has had 3 additional episodes. She drinks ~4 glasses of wine per week and does admit to snoring and daytime somnolence. She already has a referral for sleep medicine.   Today, she denies symptoms of shortness of breath, orthopnea, PND, lower extremity edema, dizziness, presyncope, syncope, bleeding, or neurologic sequela. The patient is tolerating medications without difficulties and is otherwise without complaint today.    Atrial Fibrillation Risk Factors:  she does have symptoms or diagnosis of sleep apnea. she is agreeable for sleep evaluation.  she does not have a history of rheumatic fever. she does have a history of alcohol use. The patient does not have a history of early familial atrial fibrillation or other arrhythmias.  she has a BMI of Body mass index is 29.62 kg/m.Marland Kitchen Filed Weights   09/17/21 0926  Weight: 91 kg    Family History  Problem Relation Age of Onset   Colon cancer Father 50   Hypertension Father    Colon cancer Paternal Grandmother 60   Colon cancer Paternal Grandfather 100   Colon polyps Brother 49       precancerous polyps   Hypertension Mother    Stomach cancer Neg Hx    Esophageal cancer Neg Hx    Rectal cancer Neg Hx      Atrial Fibrillation Management history:  Previous  antiarrhythmic drugs: none Previous cardioversions: none Previous ablations: none CHADS2VASC score: 2 Anticoagulation history: none   Past Medical History:  Diagnosis Date   Anxiety and depression    Atrial fibrillation ([place]) 03/04/2021   Cataract    COVID-19    Hypertension    Insomnia    Low vitamin D level    Sleep apnea    Tubular adenoma of colon 2014   Past Surgical History:  Procedure Laterality Date   COLONOSCOPY  last 01/01/2016   no prior surgery     POLYPECTOMY      Current Outpatient Medications  Medication Sig Dispense Refill   aspirin 81 MG chewable tablet Chew 162 mg by mouth daily as needed for mild pain or moderate pain.     Biotin 10 MG TABS Take by mouth.     estradiol (ESTRACE) 0.1 MG/GM vaginal cream Insert 1 gram twice weekly vaginally 42.5 g 4   hydrochlorothiazide (HYDRODIURIL) 25 MG tablet Take 25 mg by mouth daily.     Norethindrone Acetate-Ethinyl Estrad-FE (LOESTRIN 24 FE) 1-20 MG-MCG(24) tablet Take 1 tablet by mouth daily. 84 tablet 0   pantoprazole (PROTONIX) 40 MG tablet Take 1 tablet (40 mg total) by mouth daily. 90 tablet 3   propranolol (INDERAL) 10 MG tablet Take 10 mg by mouth 3 (three) times daily as needed.     VUITY 1.25 % SOLN Apply 1 drop to eye daily.     No current facility-administered medications for this encounter.    No Known Allergies  Social  History   Socioeconomic History   Marital status: Married    Spouse name: Not on file   Number of children: Not on file   Years of education: Not on file   Highest education level: Not on file  Occupational History   Not on file  Tobacco Use   Smoking status: Never   Smokeless tobacco: Never  Vaping Use   Vaping Use: Never used  Substance and Sexual Activity   Alcohol use: Yes    Alcohol/week: 6.0 - 12.0 standard drinks    Types: 6 - 12 Glasses of wine per week    Comment: 2-3 glasses a sitting drinks 3-4 days out of week 09/17/2021   Drug use: No   Sexual activity:  Yes    Partners: Male    Birth control/protection: Pill    Comment: 1st intercourse- 20, partners- refursed,  married- 40 yrs   Other Topics Concern   Not on file  Social History Narrative   Not on file   Social Determinants of Health   Financial Resource Strain: Not on file  Food Insecurity: Not on file  Transportation Needs: Not on file  Physical Activity: Not on file  Stress: Not on file  Social Connections: Not on file  Intimate Partner Violence: Not on file     ROS- All systems are reviewed and negative except as per the HPI above.  Physical Exam: Vitals:   09/17/21 0926  BP: (!) 136/92  Pulse: (!) 101  Weight: 91 kg  Height: 5\' 9"  (1.753 m)    GEN- The patient is a well appearing female, alert and oriented x 3 today.   Head- normocephalic, atraumatic Eyes-  Sclera clear, conjunctiva pink Ears- hearing intact Oropharynx- clear Neck- supple  Lungs- Clear to ausculation bilaterally, normal work of breathing Heart- Regular rate and rhythm, no murmurs, rubs or gallops  GI- soft, NT, ND, + BS Extremities- no clubbing, cyanosis, or edema MS- no significant deformity or atrophy Skin- no rash or lesion Psych- euthymic mood, full affect Neuro- strength and sensation are intact  Wt Readings from Last 3 Encounters:  09/17/21 91 kg  06/18/21 88 kg  06/13/21 88 kg    EKG today demonstrates  Sinus tachycardia Vent. rate 101 BPM PR interval 126 ms QRS duration 86 ms QT/QTcB 332/430 ms  Echo 04/01/21 demonstrated   1. Left ventricular ejection fraction, by estimation, is 60 to 65%. The  left ventricle has normal function. The left ventricle has no regional  wall motion abnormalities. There is mild asymmetric left ventricular  hypertrophy of the posterior segment. Left ventricular diastolic parameters are indeterminate.   2. Right ventricular systolic function is normal. The right ventricular  size is normal.   3. The mitral valve is normal in structure. No  evidence of mitral valve  regurgitation. No evidence of mitral stenosis.   4. The aortic valve is normal in structure. Aortic valve regurgitation is  not visualized. No aortic stenosis is present.   5. Aortic dilatation noted. There is mild dilatation of the ascending  aorta, measuring 37 mm.   6. The inferior vena cava is normal in size with greater than 50%  respiratory variability, suggesting right atrial pressure of 3 mmHg.   Epic records are reviewed at length today  CHA2DS2-VASc Score = 2  The patient's score is based upon: CHF History: 0 HTN History: 1 Diabetes History: 0 Stroke History: 0 Vascular Disease History: 0 Age Score: 0 Gender Score: 1  ASSESSMENT AND PLAN: 1. Paroxysmal Atrial Fibrillation (ICD10:  I48.0) The patient's CHA2DS2-VASc score is 2, indicating a 2.2% annual risk of stroke.   General education about afib provided and questions answered. We also discussed her stroke risk and the risks and benefits of anticoagulation. With her CV score < 3 and paroxysmal nature of her afib, will not start anticoagulation right now.  No ECGs showing afib but Apple Watch strips personally reviewed today do show afib. Will have her upload to [employer] [facility] for documentation.  We discussed therapeutic options today. Start Toprol 25 mg daily  2. Snoring/daytime somnolence  The importance of adequate treatment of sleep apnea was discussed today in order to improve our ability to maintain sinus rhythm long term. Already referred for sleep study per patient report.  Possible untreated OSA contributing as 3 out of her 4 episodes occurred in the middle of the night.   3. HTN Stable, no changes today.  4. Ascending aorta dilatation Mild, 37 mm on echo. PCP following up with CT per office notes.    Follow up in the AF clinic in 2-3 weeks.    [facility] [address] [place],  [place] [address] [phone] 09/17/2021 10:04  AM

## 2021-09-17 NOTE — Patient Instructions (Signed)
Start Metoprolol 25mg  Nightly  Upload Apple Watch Strip Showing Afib  Call back for 2 week follow-up

## 2021-09-24 ENCOUNTER — Encounter (HOSPITAL_COMMUNITY): Payer: Self-pay

## 2021-09-30 ENCOUNTER — Ambulatory Visit (HOSPITAL_COMMUNITY)
Admission: RE | Admit: 2021-09-30 | Discharge: 2021-09-30 | Disposition: A | Discharge status: Home / Self Care | Payer: 59 | Source: Ambulatory Visit | Attending: Physician Assistant | Admitting: Physician Assistant

## 2021-09-30 ENCOUNTER — Other Ambulatory Visit: Payer: Self-pay

## 2021-09-30 VITALS — BP 124/92 | HR 81 | Ht 69.0 in | Wt 198.4 lb

## 2021-09-30 DIAGNOSIS — I456 Pre-excitation syndrome: Secondary | ICD-10-CM | POA: Insufficient documentation

## 2021-09-30 DIAGNOSIS — G4733 Obstructive sleep apnea (adult) (pediatric): Secondary | ICD-10-CM | POA: Insufficient documentation

## 2021-09-30 DIAGNOSIS — I480 Paroxysmal atrial fibrillation: Secondary | ICD-10-CM | POA: Insufficient documentation

## 2021-09-30 DIAGNOSIS — I77819 Aortic ectasia, unspecified site: Secondary | ICD-10-CM | POA: Insufficient documentation

## 2021-09-30 DIAGNOSIS — Z7983 Long term (current) use of bisphosphonates: Secondary | ICD-10-CM | POA: Diagnosis not present

## 2021-09-30 DIAGNOSIS — I10 Essential (primary) hypertension: Secondary | ICD-10-CM | POA: Insufficient documentation

## 2021-09-30 NOTE — Progress Notes (Signed)
Primary Care Physician: Waldemar Dickens, MD Primary Cardiologist: none Primary Electrophysiologist: none Referring Physician: Dr Vito Berger Katie Williams is a 55 y.o. female with a history of ascending aorta dilatation, HTN, atrial fibrillation who presents for follow up in the [facility].  The patient was initially diagnosed with atrial fibrillation on her smart watch with symptoms of tachypalpitations and chest discomfort on 03/01/21. The episode resolved by the time she sought care the following day. Patient has a CHADS2VASC score of 2. She brings in her Apple Watch strips which do show true afib (personally reviewed). She has had 3 additional episodes. She drinks ~4 glasses of wine per week and does admit to snoring and daytime somnolence. Started on metoprolol 09/17/21.  On follow up today, patient reports that her afib has been worse since starting metoprolol. She has episodes almost daily since starting. She has been diagnosed with OSA and is waiting for her new equipment to arrive.   Today, she denies symptoms of shortness of breath, orthopnea, PND, lower extremity edema, dizziness, presyncope, syncope, bleeding, or neurologic sequela. The patient is tolerating medications without difficulties and is otherwise without complaint today.    Atrial Fibrillation Risk Factors:  she does have symptoms or diagnosis of sleep apnea. she is waiting for CPAP to arrive. she does not have a history of rheumatic fever. she does have a history of alcohol use. The patient does not have a history of early familial atrial fibrillation or other arrhythmias.  she has a BMI of Body mass index is 29.3 kg/m.Marland Kitchen Filed Weights   09/30/21 0836  Weight: 90 kg     Family History  Problem Relation Age of Onset   Colon cancer Father 77   Hypertension Father    Colon cancer Paternal Grandmother 26   Colon cancer Paternal Grandfather 21   Colon polyps Brother 74        precancerous polyps   Hypertension Mother    Stomach cancer Neg Hx    Esophageal cancer Neg Hx    Rectal cancer Neg Hx      Atrial Fibrillation Management history:  Previous antiarrhythmic drugs: none Previous cardioversions: none Previous ablations: none CHADS2VASC score: 2 Anticoagulation history: none   Past Medical History:  Diagnosis Date   Anxiety and depression    Atrial fibrillation ([place]) 03/04/2021   Cataract    COVID-19    Hypertension    Insomnia    Low vitamin D level    Sleep apnea    Tubular adenoma of colon 2014   Past Surgical History:  Procedure Laterality Date   COLONOSCOPY  last 01/01/2016   no prior surgery     POLYPECTOMY      Current Outpatient Medications  Medication Sig Dispense Refill   ASPIRIN PO Takes 325mg  chewable by mouth as needed for A-fib episodes     estradiol (ESTRACE) 0.1 MG/GM vaginal cream Insert 1 gram twice weekly vaginally 42.5 g 4   hydrochlorothiazide (HYDRODIURIL) 25 MG tablet Take 25 mg by mouth daily.     Norethindrone Acetate-Ethinyl Estrad-FE (LOESTRIN 24 FE) 1-20 MG-MCG(24) tablet Take 1 tablet by mouth daily. 84 tablet 0   pantoprazole (PROTONIX) 40 MG tablet Take 1 tablet (40 mg total) by mouth daily. 90 tablet 3   VUITY 1.25 % SOLN Apply 1 drop to eye daily.     No current facility-administered medications for this encounter.    No Known Allergies  Social History  Socioeconomic History   Marital status: Married    Spouse name: Not on file   Number of children: Not on file   Years of education: Not on file   Highest education level: Not on file  Occupational History   Not on file  Tobacco Use   Smoking status: Never   Smokeless tobacco: Never  Vaping Use   Vaping Use: Never used  Substance and Sexual Activity   Alcohol use: Yes    Alcohol/week: 6.0 - 12.0 standard drinks    Types: 6 - 12 Glasses of wine per week    Comment: 2-3 glasses a sitting drinks 3-4 days out of week 09/17/2021   Drug use:  No   Sexual activity: Yes    Partners: Male    Birth control/protection: Pill    Comment: 1st intercourse- 20, partners- refursed,  married- 50 yrs   Other Topics Concern   Not on file  Social History Narrative   Not on file   Social Determinants of Health   Financial Resource Strain: Not on file  Food Insecurity: Not on file  Transportation Needs: Not on file  Physical Activity: Not on file  Stress: Not on file  Social Connections: Not on file  Intimate Partner Violence: Not on file     ROS- All systems are reviewed and negative except as per the HPI above.  Physical Exam: Vitals:   09/30/21 0836  BP: (!) 124/92  Pulse: 81  Weight: 90 kg  Height: 5\' 9"  (1.753 m)    GEN- The patient is a well appearing female, alert and oriented x 3 today.   HEENT-head normocephalic, atraumatic, sclera clear, conjunctiva pink, hearing intact, trachea midline. Lungs- Clear to ausculation bilaterally, normal work of breathing Heart- Regular rate and rhythm, no murmurs, rubs or gallops  GI- soft, NT, ND, + BS Extremities- no clubbing, cyanosis, or edema MS- no significant deformity or atrophy Skin- no rash or lesion Psych- euthymic mood, full affect Neuro- strength and sensation are intact   Wt Readings from Last 3 Encounters:  09/30/21 90 kg  09/17/21 91 kg  06/18/21 88 kg    EKG today demonstrates  AR vs ectopic rhythm short PR Vent. rate 81 BPM PR interval 110 ms QRS duration 100 ms QT/QTcB 380/441 ms  Echo 04/01/21 demonstrated   1. Left ventricular ejection fraction, by estimation, is 60 to 65%. The  left ventricle has normal function. The left ventricle has no regional  wall motion abnormalities. There is mild asymmetric left ventricular  hypertrophy of the posterior segment. Left ventricular diastolic parameters are indeterminate.   2. Right ventricular systolic function is normal. The right ventricular  size is normal.   3. The mitral valve is normal in structure.  No evidence of mitral valve  regurgitation. No evidence of mitral stenosis.   4. The aortic valve is normal in structure. Aortic valve regurgitation is  not visualized. No aortic stenosis is present.   5. Aortic dilatation noted. There is mild dilatation of the ascending  aorta, measuring 37 mm.   6. The inferior vena cava is normal in size with greater than 50%  respiratory variability, suggesting right atrial pressure of 3 mmHg.   Epic records are reviewed at length today  CHA2DS2-VASc Score = 2  The patient's score is based upon: CHF History: 0 HTN History: 1 Diabetes History: 0 Stroke History: 0 Vascular Disease History: 0 Age Score: 0 Gender Score: 1      ASSESSMENT AND PLAN:  1. Paroxysmal Atrial Fibrillation (ICD10:  I48.0) The patient's CHA2DS2-VASc score is 2, indicating a 2.2% annual risk of stroke.   Patient having more frequent episodes of afib. ? Pre-excitation with short PR today and increased episodes on AV nodal agent.  Will stop BB today and refer to EP for evaluation.  Not currently on anticoagulation with low CV score.  2. OSA New diagnosis. Waiting on CPAP.  3. HTN Stable, no changes today.  4. Ascending aorta dilatation Mild, 37 mm on echo.  PCP following up with CT per office notes.    Follow up with EP for evaluation.    [facility] [address] [place], [place] [address] [phone] 09/30/2021 9:09 AM

## 2021-09-30 NOTE — Patient Instructions (Signed)
Stop metoprolol and propranolol  Follow up with EP - their office will call you.

## 2021-10-01 ENCOUNTER — Ambulatory Visit (HOSPITAL_COMMUNITY): Payer: 59 | Admitting: Physician Assistant

## 2021-10-21 ENCOUNTER — Ambulatory Visit (INDEPENDENT_AMBULATORY_CARE_PROVIDER_SITE_OTHER): Payer: 59 | Admitting: Obstetrics & Gynecology

## 2021-10-21 ENCOUNTER — Encounter: Payer: Self-pay | Admitting: Obstetrics & Gynecology

## 2021-10-21 ENCOUNTER — Other Ambulatory Visit: Payer: Self-pay

## 2021-10-21 VITALS — BP 132/88 | HR 122 | Ht 69.0 in | Wt 196.0 lb

## 2021-10-21 DIAGNOSIS — Z3041 Encounter for surveillance of contraceptive pills: Secondary | ICD-10-CM

## 2021-10-21 DIAGNOSIS — N951 Menopausal and female climacteric states: Secondary | ICD-10-CM | POA: Diagnosis not present

## 2021-10-21 DIAGNOSIS — Z01419 Encounter for gynecological examination (general) (routine) without abnormal findings: Secondary | ICD-10-CM | POA: Diagnosis not present

## 2021-10-21 DIAGNOSIS — N3946 Mixed incontinence: Secondary | ICD-10-CM

## 2021-10-21 DIAGNOSIS — L74519 Primary focal hyperhidrosis, unspecified: Secondary | ICD-10-CM | POA: Diagnosis not present

## 2021-10-21 MED ORDER — NORETHIN ACE-ETH ESTRAD-FE 1-20 MG-MCG(24) PO TABS: 1.0000 | ORAL_TABLET | Freq: Every day | ORAL | 4 refills | Status: DC

## 2021-10-21 NOTE — Progress Notes (Signed)
Katie Williams [facility] 29-Nov-1965 [id number]   History:    55 y.o.  G0 Married.  Works for [employer].   RP:  Established patient presenting for annual gyn exam    HPI: Well on LoEstrin 24 Fe 1/20 generic.  Light occasional menses.  Hot flushes increasing and night sweats most nights.  No pelvic pain. Using Estrace cream for dryness with intercourse.  Pap Neg in 06/2020.  C/O SUI and Urgency.  Excessive sweating under armpits and at pubic area.  Bowel movements normal. Breasts normal. Mammo Neg 07/2021.  BMI 28.94.  Very good muscle mass.  Was very fit, going to the Gym.  Healthy nutrition. Health Labs with Fam MD.  Colonoscopy 2020, q3 yrs. Fam h/o Colon Cancer.  Past medical history,surgical history, family history and social history were all reviewed and documented in the EPIC chart.  Gynecologic History Patient's last menstrual period was 10/01/2021 (approximate).  Obstetric History OB History  Gravida Para Term Preterm AB Living  0 0 0 0 0 0  SAB IAB Ectopic Multiple Live Births  0 0 0 0 0     ROS: A ROS was performed and pertinent positives and negatives are included in the history.  GENERAL: No fevers or chills. HEENT: No change in vision, no earache, sore throat or sinus congestion. NECK: No pain or stiffness. CARDIOVASCULAR: No chest pain or pressure. No palpitations. PULMONARY: No shortness of breath, cough or wheeze. GASTROINTESTINAL: No abdominal pain, nausea, vomiting or diarrhea, melena or bright red blood per rectum. GENITOURINARY: No urinary frequency, urgency, hesitancy or dysuria. MUSCULOSKELETAL: No joint or muscle pain, no back pain, no recent trauma. DERMATOLOGIC: No rash, no itching, no lesions. ENDOCRINE: No polyuria, polydipsia, no heat or cold intolerance. No recent change in weight. HEMATOLOGICAL: No anemia or easy bruising or bleeding. NEUROLOGIC: No headache, seizures, numbness, tingling or weakness. PSYCHIATRIC: No depression, no loss of interest in normal activity or  change in sleep pattern.     Exam:   BP 132/88   Pulse (!) 122   Ht 5\' 9"  (1.753 m)   Wt 196 lb (88.9 kg)   LMP 10/01/2021 (Approximate)   SpO2 99%   BMI 28.94 kg/m   Body mass index is 28.94 kg/m.  General appearance : Well developed well nourished female. No acute distress HEENT: Eyes: no retinal hemorrhage or exudates,  Neck supple, trachea midline, no carotid bruits, no thyroidmegaly Lungs: Clear to auscultation, no rhonchi or wheezes, or rib retractions  Heart: Regular rate and rhythm, no murmurs or gallops Breast:Examined in sitting and supine position were symmetrical in appearance, no palpable masses or tenderness,  no skin retraction, no nipple inversion, no nipple discharge, no skin discoloration, no axillary or supraclavicular lymphadenopathy Abdomen: no palpable masses or tenderness, no rebound or guarding Extremities: no edema or skin discoloration or tenderness  Pelvic: Vulva: Normal             Vagina: No gross lesions or discharge  Cervix: No gross lesions or discharge  Uterus  AV, normal size, shape and consistency, non-tender and mobile  Adnexa  Without masses or tenderness  Anus: Normal   Assessment/Plan:  55 y.o. female for annual exam   1. Well female exam with routine gynecological exam Well on LoEstrin 24 Fe 1/20 generic.  Light occasional menses.  Hot flushes increasing and night sweats most nights.  No pelvic pain. Using Estrace cream for dryness with intercourse.  Pap Neg in 06/2020.  C/O SUI and Urgency.  Excessive sweating under armpits and at pubic area.  Bowel movements normal. Breasts normal. Mammo Neg 07/2021.  BMI 28.94.  Very good muscle mass.  Was very fit, going to the Gym.  Healthy nutrition. Health Labs with Fam MD.  Colonoscopy 2020, q3 yrs. Fam h/o Colon Cancer.  2. Perimenopause Well on LoEstrin 24 Fe 1/20 generic.  Light occasional menses.  Hot flushes increasing and night sweats most nights.  No pelvic pain. Using Estrace cream for  dryness with intercourse. Will f/u at the end of her week off active BCPs for an [facility] level. - FSH; Future  3. Encounter for surveillance of contraceptive pills Well on LoEstrin 24 Fe 1/20 generic.  Light occasional menses. No CI to continue on BCPs.  Prescription sent to pharmacy.  4. Mixed stress and urge urinary incontinence Counseling on SUI and Urgency.  Recommend avoiding overfilling of the bladder and caffeine products.  Kegel exercises recommended.  Refer to Uro for Urodynamic testing.  5. Excessive sweating, local at the armpits and pubic area Refer to [facility].  Other orders - Norethindrone Acetate-Ethinyl Estrad-FE (LOESTRIN 24 FE) 1-20 MG-MCG(24) tablet; Take 1 tablet by mouth daily.   [name] MD, 3:29 PM 10/21/2021

## 2021-11-06 NOTE — Progress Notes (Signed)
Electrophysiology Office Note:    Date:  11/07/2021   ID:  Katie Williams, DOB 1966-10-29, MRN [id number]  PCP:  Waldemar Dickens, MD  [facility] Cardiologist:  None  [facility] Electrophysiologist:  Vickie Epley, MD   Referring MD: Oliver Barre, PA   Chief Complaint: AF and ? preexcitation  History of Present Illness:    Katie Williams is a 55 y.o. female who presents for an evaluation of AF and possible preexcitation at the request of Adline Peals, PA-C. Their medical history includes AF, HTN, OSA on CPAP and anxiety. She was seen by [facility] on 09/30/2021 for pAF. She was previously started on a BB but this actually made her AF symptoms worse so it was stopped. She has a CHADSVASc of 2 and is not currently on an anticoagulant.   The patient confirms the above.  She continues to have highly symptomatic episodes of atrial fibrillation.  They have become very frequent over the last few weeks.  They occur almost daily.  They last hours at a time without a clear trigger.  She has sleep apnea and has been using a CPAP machine for 3 weeks.  She is working on increasing her exercise and losing weight.    Past Medical History:  Diagnosis Date   Anxiety and depression    Atrial fibrillation ([place]) 03/04/2021   Cataract    COVID-19    Hypertension    Insomnia    Low vitamin D level    Sleep apnea    Tubular adenoma of colon 2014    Past Surgical History:  Procedure Laterality Date   COLONOSCOPY  last 01/01/2016   no prior surgery     POLYPECTOMY      Current Medications: Current Meds  Medication Sig   ASPIRIN PO Takes 325mg  chewable by mouth as needed for A-fib episodes   estradiol (ESTRACE) 0.1 MG/GM vaginal cream Insert 1 gram twice weekly vaginally   hydrochlorothiazide (HYDRODIURIL) 25 MG tablet Take 25 mg by mouth daily.   Norethindrone Acetate-Ethinyl Estrad-FE (LOESTRIN 24 FE) 1-20 MG-MCG(24) tablet Take 1 tablet by mouth daily.   pantoprazole  (PROTONIX) 40 MG tablet Take 1 tablet (40 mg total) by mouth daily.   VUITY 1.25 % SOLN Apply 1 drop to eye daily.     Allergies:   Patient has no known allergies.   Social History   Socioeconomic History   Marital status: Married    Spouse name: Not on file   Number of children: Not on file   Years of education: Not on file   Highest education level: Not on file  Occupational History   Not on file  Tobacco Use   Smoking status: Never   Smokeless tobacco: Never  Vaping Use   Vaping Use: Never used  Substance and Sexual Activity   Alcohol use: Yes    Alcohol/week: 6.0 - 12.0 standard drinks    Types: 6 - 12 Glasses of wine per week    Comment: 2-3 glasses a sitting drinks 3-4 days out of week 09/17/2021   Drug use: No   Sexual activity: Yes    Partners: Male    Birth control/protection: Pill    Comment: 1st intercourse- 20, partners- refursed,  married- 49 yrs   Other Topics Concern   Not on file  Social History Narrative   Not on file   Social Determinants of Health   Financial Resource Strain: Not on file  Food Insecurity: Not on  file  Transportation Needs: Not on file  Physical Activity: Not on file  Stress: Not on file  Social Connections: Not on file     Family History: The patient's family history includes Colon cancer (age of onset: 22) in her paternal grandfather; Colon cancer (age of onset: 45) in her paternal grandmother; Colon cancer (age of onset: 77) in her father; Colon polyps (age of onset: 75) in her brother; Hypertension in her father and mother. There is no history of Stomach cancer, Esophageal cancer, or Rectal cancer.  ROS:   Please see the history of present illness.    All other systems reviewed and are negative.  EKGs/Labs/Other Studies Reviewed:    The following studies were reviewed today:  04/01/2021 Echo LV normal RV normal No MR  09/30/2021 ECG shows  ectopic atrial rhyth vs junctional rhythm 09/17/2021 ECG shows sinus rhythm  with short PR. No obvious preexcitation  EKG:  The ekg ordered today demonstrates sinus rhythm.  No definite preexcitation.  The PR interval is 130 ms.   Recent Labs: 03/02/2021: ALT 19; BUN 18; Creat 1.29; Hemoglobin 15.5; Platelets 269; Potassium 4.4; Sodium 137; TSH 1.69  Recent Lipid Panel No results found for: CHOL, TRIG, HDL, CHOLHDL, VLDL, LDLCALC, LDLDIRECT  Physical Exam:    VS:  BP 132/90    Pulse 82    Ht 5\' 9"  (1.753 m)    Wt 202 lb 6.4 oz (91.8 kg)    SpO2 97%    BMI 29.89 kg/m     Wt Readings from Last 3 Encounters:  11/07/21 202 lb 6.4 oz (91.8 kg)  10/21/21 196 lb (88.9 kg)  09/30/21 198 lb 6.4 oz (90 kg)     GEN:  Well nourished, well developed in no acute distress HEENT: Normal NECK: No JVD; No carotid bruits LYMPHATICS: No lymphadenopathy CARDIAC: RRR, no murmurs, rubs, gallops RESPIRATORY:  Clear to auscultation without rales, wheezing or rhonchi  ABDOMEN: Soft, non-tender, non-distended MUSCULOSKELETAL:  No edema; No deformity  SKIN: Warm and dry NEUROLOGIC:  Alert and oriented x 3 PSYCHIATRIC:  Normal affect       ASSESSMENT:    1. Atrial fibrillation with RVR (HCC)   2. Paroxysmal atrial fibrillation (HCC)   3. Junctional rhythm    PLAN:    In order of problems listed above:  #Paroxysmal atrial fibrillation Highly symptomatic and increasing frequency of episodes.  We discussed treatment options including antiarrhythmic drug therapy or catheter ablation.  I do think she is a good candidate for catheter ablation.  She would like to proceed.  She is currently not taking anticoagulant.  I will like her to start Eliquis at least 4 weeks prior to the ablation procedure.  We will plan to continue it for at least 6 months after the ablation procedure.  During the ablation, I would like to perform a full EP study with Isopril to confirm no accessory pathway or other inducible arrhythmias given her history of a junctional rhythm and short PR interval. I  discussed the catheter ablation procedure in detail include the risk, recovery and likelihood of success and she elects to proceed.  Risk, benefits, and alternatives to EP study and radiofrequency ablation for afib were also discussed in detail today. These risks include but are not limited to stroke, bleeding, vascular damage, tamponade, perforation, damage to the esophagus, lungs, and other structures, pulmonary vein stenosis, worsening renal function, and death. The patient understands these risk and wishes to proceed.  We will therefore proceed  with catheter ablation at the next available time.  Carto, ICE, anesthesia are requested for the procedure.  Will also obtain CT PV protocol prior to the procedure to exclude LAA thrombus and further evaluate atrial anatomy.   #Obstructive sleep apnea Encourage continued CPAP use  #Overweight Encouraged weight loss.  Discussed link between overweight and A. fib ablation success rates.    Total time spent with patient today 65 minutes. This includes reviewing records, evaluating the patient and coordinating care.  Medication Adjustments/Labs and Tests Ordered: Current medicines are reviewed at length with the patient today.  Concerns regarding medicines are outlined above.  No orders of the defined types were placed in this encounter.  No orders of the defined types were placed in this encounter.    Signed, Hilton Cork. Quentin Ore, MD, [facility], [facility] 11/07/2021 11:27 AM    Electrophysiology [facility]

## 2021-11-07 ENCOUNTER — Ambulatory Visit: Payer: 59 | Admitting: Cardiology

## 2021-11-07 ENCOUNTER — Other Ambulatory Visit: Payer: Self-pay

## 2021-11-07 ENCOUNTER — Encounter: Payer: Self-pay | Admitting: Cardiology

## 2021-11-07 VITALS — BP 132/90 | HR 82 | Ht 69.0 in | Wt 202.4 lb

## 2021-11-07 DIAGNOSIS — I498 Other specified cardiac arrhythmias: Secondary | ICD-10-CM

## 2021-11-07 DIAGNOSIS — E663 Overweight: Secondary | ICD-10-CM

## 2021-11-07 DIAGNOSIS — Z9989 Dependence on other enabling machines and devices: Secondary | ICD-10-CM

## 2021-11-07 DIAGNOSIS — I480 Paroxysmal atrial fibrillation: Secondary | ICD-10-CM | POA: Diagnosis not present

## 2021-11-07 DIAGNOSIS — I4891 Unspecified atrial fibrillation: Secondary | ICD-10-CM

## 2021-11-07 DIAGNOSIS — G4733 Obstructive sleep apnea (adult) (pediatric): Secondary | ICD-10-CM

## 2021-11-07 MED ORDER — APIXABAN 5 MG PO TABS: 5.0000 mg | ORAL_TABLET | Freq: Two times a day (BID) | ORAL | 11 refills | Status: DC

## 2021-11-07 MED ORDER — METOPROLOL TARTRATE 100 MG PO TABS: ORAL_TABLET | ORAL | 0 refills | Status: DC

## 2021-11-07 NOTE — Patient Instructions (Addendum)
Medication Instructions:  Your physician has recommended you make the following change in your medication:   On November 25, 2021 you will START taking Eliquis 5 mg by mouth TWICE a day.  WHEN you start the Eliquis you will STOP your ASPIRIN.   Lab Work: None ordered. If you have labs (blood work) drawn today and your tests are completely normal, you will receive your results only by: [facility] (if you have MyChart) OR A paper copy in the mail If you have any lab test that is abnormal or we need to change your treatment, we will call you to review the results.  Testing/Procedures: None ordered.  Follow-Up:  SEE INSTRUCTION LETTER  Apixaban Tablets What is this medication? APIXABAN (a PIX a ban) prevents or treats blood clots. It is also used to lower the risk of stroke in people with AFib (atrial fibrillation). It belongs to a group of medications called blood thinners. This medicine may be used for other purposes; ask your health care provider or pharmacist if you have questions. COMMON BRAND NAME(S): Eliquis What should I tell my care team before I take this medication? They need to know if you have any of these conditions: Antiphospholipid antibody syndrome Bleeding disorder History of bleeding in the brain History of blood clots History of stomach bleeding Kidney disease Liver disease Mechanical heart valve Spinal surgery An unusual or allergic reaction to apixaban, other medications, foods, dyes, or preservatives Pregnant or trying to get pregnant Breast-feeding How should I use this medication? Take this medication by mouth. For your therapy to work as well as possible, take each dose exactly as prescribed on the prescription label. Do not skip doses. Skipping doses or stopping this medication can increase your risk of a blood clot or stroke. Keep taking this medication unless your care team tells you to stop. Take it as directed on the prescription label at the  same time every day. You can take it with or without food. If it upsets your stomach, take it with food. A special MedGuide will be given to you by the pharmacist with each prescription and refill. Be sure to read this information carefully each time. Talk to your care team about the use of this medication in children. Special care may be needed. Overdosage: If you think you have taken too much of this medicine contact a poison control center or emergency room at once. NOTE: This medicine is only for you. Do not share this medicine with others. What if I miss a dose? If you miss a dose, take it as soon as you can. If it is almost time for your next dose, take only that dose. Do not take double or extra doses. What may interact with this medication? This medication may interact with the following: Aspirin and aspirin-like medications Certain medications for fungal infections like itraconazole and ketoconazole Certain medications for seizures like carbamazepine and phenytoin Certain medications for blood clots like enoxaparin, dalteparin, heparin, and warfarin Clarithromycin NSAIDs, medications for pain and inflammation, like ibuprofen or naproxen Rifampin Ritonavir St. John's wort This list may not describe all possible interactions. Give your health care provider a list of all the medicines, herbs, non-prescription drugs, or dietary supplements you use. Also tell them if you smoke, drink alcohol, or use illegal drugs. Some items may interact with your medicine. What should I watch for while using this medication? Visit your healthcare professional for regular checks on your progress. You may need blood work done while you  are taking this medication. Your condition will be monitored carefully while you are receiving this medication. It is important not to miss any appointments. Avoid sports and activities that might cause injury while you are using this medication. Severe falls or injuries can  cause unseen bleeding. Be careful when using sharp tools or knives. Consider using an Copy. Take special care brushing or flossing your teeth. Report any injuries, bruising, or red spots on the skin to your healthcare professional. If you are going to need surgery or other procedure, tell your healthcare professional that you are taking this medication. Wear a medical ID bracelet or chain. Carry a card that describes your disease and details of your medication and dosage times. What side effects may I notice from receiving this medication? Side effects that you should report to your care team as soon as possible: Allergic reactions--skin rash, itching, hives, swelling of the face, lips, tongue, or throat Bleeding--bloody or black, tar-like stools, vomiting blood or brown material that looks like coffee grounds, red or dark brown urine, small red or purple spots on the skin, unusual bruising or bleeding Bleeding in the brain--severe headache, stiff neck, confusion, dizziness, change in vision, numbness or weakness of the face, arm, or leg, trouble speaking, trouble walking, vomiting Heavy periods This list may not describe all possible side effects. Call your doctor for medical advice about side effects. You may report side effects to FDA at 1-800-FDA-1088. Where should I keep my medication? Keep out of the reach of children and pets. Store at room temperature between 20 and 25 degrees C (68 and 77 degrees F). Get rid of any unused medication after the expiration date. To get rid of medications that are no longer needed or expired: Take the medication to a medication take-back program. Check with your pharmacy or law enforcement to find a location. If you cannot return the medication, check the label or package insert to see if the medication should be thrown out in the garbage or flushed down the toilet. If you are not sure, ask your care team. If it is safe to put in the trash, empty the  medication out of the container. Mix the medication with cat litter, dirt, coffee grounds, or other unwanted substance. Seal the mixture in a bag or container. Put it in the trash. NOTE: This sheet is a summary. It may not cover all possible information. If you have questions about this medicine, talk to your doctor, pharmacist, or health care provider.  2022 Elsevier/Gold Standard (2020-11-23 00:00:00)  Cardiac Ablation Cardiac ablation is a procedure to destroy, or ablate, a small amount of heart tissue in very specific places. The heart has many electrical connections. Sometimes these connections are abnormal and can cause the heart to beat very fast or irregularly. Ablating some of the areas that cause problems can improve the heart's rhythm or return it to normal. Ablation may be done for people who: Have Wolff-Parkinson-White syndrome. Have fast heart rhythms (tachycardia). Have taken medicines for an abnormal heart rhythm (arrhythmia) that were not effective or caused side effects. Have a high-risk heartbeat that may be life-threatening. During the procedure, a small incision is made in the neck or the groin, and a long, thin tube (catheter) is inserted into the incision and moved to the heart. Small devices (electrodes) on the tip of the catheter will send out electrical currents. A type of X-ray (fluoroscopy) will be used to help guide the catheter and to provide images of the  heart. Tell a health care provider about: Any allergies you have. All medicines you are taking, including vitamins, herbs, eye drops, creams, and over-the-counter medicines. Any problems you or family members have had with anesthetic medicines. Any blood disorders you have. Any surgeries you have had. Any medical conditions you have, such as kidney failure. Whether you are pregnant or may be pregnant. What are the risks? Generally, this is a safe procedure. However, problems may occur,  including: Infection. Bruising and bleeding at the catheter insertion site. Bleeding into the chest, especially into the sac that surrounds the heart. This is a serious complication. Stroke or blood clots. Damage to nearby structures or organs. Allergic reaction to medicines or dyes. Need for a permanent pacemaker if the normal electrical system is damaged. A pacemaker is a small computer that sends electrical signals to the heart and helps your heart beat normally. The procedure not being fully effective. This may not be recognized until months later. Repeat ablation procedures are sometimes done. What happens before the procedure? Medicines Ask your health care provider about: Changing or stopping your regular medicines. This is especially important if you are taking diabetes medicines or blood thinners. Taking medicines such as aspirin and ibuprofen. These medicines can thin your blood. Do not take these medicines unless your health care provider tells you to take them. Taking over-the-counter medicines, vitamins, herbs, and supplements. General instructions Follow instructions from your health care provider about eating or drinking restrictions. Plan to have someone take you home from the hospital or clinic. If you will be going home right after the procedure, plan to have someone with you for 24 hours. Ask your health care provider what steps will be taken to prevent infection. What happens during the procedure?  An IV will be inserted into one of your veins. You will be given a medicine to help you relax (sedative). The skin on your neck or groin will be numbed. An incision will be made in your neck or your groin. A needle will be inserted through the incision and into a large vein in your neck or groin. A catheter will be inserted into the needle and moved to your heart. Dye may be injected through the catheter to help your surgeon see the area of the heart that needs  treatment. Electrical currents will be sent from the catheter to ablate heart tissue in desired areas. There are three types of energy that may be used to do this: Heat (radiofrequency energy). Laser energy. Extreme cold (cryoablation). When the tissue has been ablated, the catheter will be removed. Pressure will be held on the insertion area to prevent a lot of bleeding. A bandage (dressing) will be placed over the insertion area. The exact procedure may vary among health care providers and hospitals. What happens after the procedure? Your blood pressure, heart rate, breathing rate, and blood oxygen level will be monitored until you leave the hospital or clinic. Your insertion area will be monitored for bleeding. You will need to lie still for a few hours to ensure that you do not bleed from the insertion area. Do not drive for 24 hours or as long as told by your health care provider. Summary Cardiac ablation is a procedure to destroy, or ablate, a small amount of heart tissue using an electrical current. This procedure can improve the heart rhythm or return it to normal. Tell your health care provider about any medical conditions you may have and all medicines you are taking to  treat them. This is a safe procedure, but problems may occur. Problems may include infection, bruising, damage to nearby organs or structures, or allergic reactions to medicines. Follow your health care provider's instructions about eating and drinking before the procedure. You may also be told to change or stop some of your medicines. After the procedure, do not drive for 24 hours or as long as told by your health care provider. This information is not intended to replace advice given to you by your health care provider. Make sure you discuss any questions you have with your health care provider. Document Revised: 09/05/2019 Document Reviewed: 09/05/2019 Elsevier Patient Education  [place].

## 2021-12-11 ENCOUNTER — Other Ambulatory Visit: Payer: Self-pay

## 2021-12-11 ENCOUNTER — Other Ambulatory Visit: Payer: 59 | Admitting: *Deleted

## 2021-12-11 DIAGNOSIS — I480 Paroxysmal atrial fibrillation: Secondary | ICD-10-CM

## 2021-12-11 DIAGNOSIS — E663 Overweight: Secondary | ICD-10-CM

## 2021-12-11 DIAGNOSIS — Z9989 Dependence on other enabling machines and devices: Secondary | ICD-10-CM

## 2021-12-11 DIAGNOSIS — G4733 Obstructive sleep apnea (adult) (pediatric): Secondary | ICD-10-CM

## 2021-12-11 DIAGNOSIS — I498 Other specified cardiac arrhythmias: Secondary | ICD-10-CM

## 2021-12-11 DIAGNOSIS — I4891 Unspecified atrial fibrillation: Secondary | ICD-10-CM

## 2021-12-11 LAB — BASIC METABOLIC PANEL
BUN/Creatinine Ratio: 9 (ref 9–23)
BUN: 9 mg/dL (ref 6–24)
CO2: 23 mmol/L (ref 20–29)
Calcium: 9.2 mg/dL (ref 8.7–10.2)
Chloride: 100 mmol/L (ref 96–106)
Creatinine, Ser: 0.98 mg/dL (ref 0.57–1.00)
Glucose: 99 mg/dL (ref 70–99)
Potassium: 3.4 mmol/L — ABNORMAL LOW (ref 3.5–5.2)
Sodium: 139 mmol/L (ref 134–144)
eGFR: 69 mL/min/{1.73_m2} (ref >59)

## 2021-12-11 LAB — CBC WITH DIFFERENTIAL/PLATELET
Basophils Absolute: 0.1 10*3/uL (ref 0.0–0.2)
Basos: 1 %
EOS (ABSOLUTE): 0.1 10*3/uL (ref 0.0–0.4)
Eos: 1 %
Hematocrit: 41.3 % (ref 34.0–46.6)
Hemoglobin: 13.9 g/dL (ref 11.1–15.9)
Immature Grans (Abs): 0 10*3/uL (ref 0.0–0.1)
Immature Granulocytes: 0 %
Lymphocytes Absolute: 1.6 10*3/uL (ref 0.7–3.1)
Lymphs: 23 %
MCH: 35 pg — ABNORMAL HIGH (ref 26.6–33.0)
MCHC: 33.7 g/dL (ref 31.5–35.7)
MCV: 104 fL — ABNORMAL HIGH (ref 79–97)
Monocytes Absolute: 0.6 10*3/uL (ref 0.1–0.9)
Monocytes: 9 %
Neutrophils Absolute: 4.4 10*3/uL (ref 1.4–7.0)
Neutrophils: 66 %
Platelets: 265 10*3/uL (ref 150–450)
RBC: 3.97 x10E6/uL (ref 3.77–5.28)
RDW: 12.5 % (ref 11.7–15.4)
WBC: 6.7 10*3/uL (ref 3.4–10.8)

## 2021-12-18 ENCOUNTER — Telehealth (HOSPITAL_COMMUNITY): Payer: Self-pay | Admitting: Emergency Medicine

## 2021-12-18 NOTE — Telephone Encounter (Signed)
Reaching out to patient to offer assistance regarding upcoming cardiac imaging study; pt verbalizes understanding of appt date/time, parking situation and where to check in, pre-test NPO status and medications ordered, and verified current allergies; name and call back number provided for further questions should they arise Marchia Bond RN Navigator Cardiac Imaging [facility] [phone] office [phone] cell  Denies iv issues 100mg  metoprolol tartrate  Arrival 100

## 2021-12-19 ENCOUNTER — Telehealth: Payer: Self-pay | Admitting: Cardiology

## 2021-12-19 ENCOUNTER — Ambulatory Visit (HOSPITAL_COMMUNITY)
Admission: RE | Admit: 2021-12-19 | Discharge: 2021-12-19 | Disposition: A | Discharge status: Home / Self Care | Payer: 59 | Source: Ambulatory Visit | Attending: Cardiology | Admitting: Cardiology

## 2021-12-19 ENCOUNTER — Other Ambulatory Visit: Payer: Self-pay

## 2021-12-19 DIAGNOSIS — I480 Paroxysmal atrial fibrillation: Secondary | ICD-10-CM | POA: Insufficient documentation

## 2021-12-19 DIAGNOSIS — R918 Other nonspecific abnormal finding of lung field: Secondary | ICD-10-CM

## 2021-12-19 MED ORDER — IOHEXOL 350 MG/ML SOLN
95.0000 mL | Freq: Once | INTRAVENOUS | Status: AC | PRN
  Administered 2021-12-19: 95 mL via INTRAVENOUS

## 2021-12-19 NOTE — Telephone Encounter (Signed)
Spoke with Marzetta Board from [facility] calling to report an over read on pt's Calcium Score.  See report as below:  ADDENDUM REPORT: 12/19/2021 16:18   EXAM: OVER-READ INTERPRETATION  CT CHEST   The following report is an over-read performed by radiologist Dr. Alvino Blood [facility] on 12/19/2021. This over-read does not include interpretation of cardiac or coronary anatomy or pathology. The CTA interpretation by the cardiologist is attached.   COMPARISON:  None.   FINDINGS: Limited view of the lung parenchyma demonstrates 2 rounded nodules over the RIGHT hemidiaphragm measure 10 mm and 8 mm (image 34/9). Nodule appear low density. Airways are normal.   Limited view of the mediastinum demonstrates no adenopathy. Esophagus normal.   Limited view of the upper abdomen unremarkable.   Limited view of the skeleton and chest wall is unremarkable.   IMPRESSION: Two rounded low-density nodules over the RIGHT hemidiaphragm. Favor benign nodules however recommend follow-up diagnostic CT in 6 months to demonstrate stability.  Will forward information to Dr Quentin Ore and his RN to make them aware.  Complete report is available in Epic.

## 2021-12-19 NOTE — Telephone Encounter (Signed)
° °  [facility] calling to give calcium score report

## 2021-12-23 NOTE — Telephone Encounter (Signed)
Went over findings with the patient. Advised CT in 6 months for follow up on stability and made PCP aware via epic.   Placed order due in 6 months.

## 2021-12-25 NOTE — Pre-Procedure Instructions (Signed)
Attempted to call patient regarding procedure instructions.  Left voice mail on the following items: Arrival time 1130 Nothing to eat or drink after midnight No meds AM of procedure Responsible person to drive you home and stay with you for 24 hrs  Have you missed any doses of anti-coagulant Eliquis- take both doses today, don't take in the morning

## 2021-12-26 ENCOUNTER — Encounter (HOSPITAL_COMMUNITY)
Admission: RE | Disposition: A | Discharge status: Home / Self Care | Payer: 59 | Source: Home / Self Care | Attending: Cardiology

## 2021-12-26 ENCOUNTER — Ambulatory Visit (HOSPITAL_BASED_OUTPATIENT_CLINIC_OR_DEPARTMENT_OTHER): Payer: 59 | Admitting: Anesthesiology

## 2021-12-26 ENCOUNTER — Ambulatory Visit (HOSPITAL_COMMUNITY)
Admission: RE | Admit: 2021-12-26 | Discharge: 2021-12-26 | Disposition: A | Discharge status: Home / Self Care | Payer: 59 | Attending: Cardiology | Admitting: Cardiology

## 2021-12-26 ENCOUNTER — Encounter (HOSPITAL_COMMUNITY): Payer: Self-pay | Admitting: Cardiology

## 2021-12-26 ENCOUNTER — Other Ambulatory Visit: Payer: Self-pay

## 2021-12-26 ENCOUNTER — Ambulatory Visit (HOSPITAL_COMMUNITY): Payer: 59 | Admitting: Anesthesiology

## 2021-12-26 DIAGNOSIS — G4733 Obstructive sleep apnea (adult) (pediatric): Secondary | ICD-10-CM | POA: Diagnosis not present

## 2021-12-26 DIAGNOSIS — F419 Anxiety disorder, unspecified: Secondary | ICD-10-CM | POA: Insufficient documentation

## 2021-12-26 DIAGNOSIS — I4891 Unspecified atrial fibrillation: Secondary | ICD-10-CM | POA: Diagnosis not present

## 2021-12-26 DIAGNOSIS — I10 Essential (primary) hypertension: Secondary | ICD-10-CM

## 2021-12-26 DIAGNOSIS — I480 Paroxysmal atrial fibrillation: Secondary | ICD-10-CM | POA: Insufficient documentation

## 2021-12-26 DIAGNOSIS — Z9989 Dependence on other enabling machines and devices: Secondary | ICD-10-CM | POA: Diagnosis not present

## 2021-12-26 DIAGNOSIS — I483 Typical atrial flutter: Secondary | ICD-10-CM | POA: Diagnosis not present

## 2021-12-26 HISTORY — PX: ATRIAL FIBRILLATION ABLATION: EP1191

## 2021-12-26 LAB — BASIC METABOLIC PANEL
Anion gap: 12 (ref 5–15)
BUN: 10 mg/dL (ref 6–20)
CO2: 24 mmol/L (ref 22–32)
Calcium: 9.4 mg/dL (ref 8.9–10.3)
Chloride: 104 mmol/L (ref 98–111)
Creatinine, Ser: 1.01 mg/dL — ABNORMAL HIGH (ref 0.44–1.00)
GFR, Estimated: >60 mL/min (ref >60)
Glucose, Bld: 96 mg/dL (ref 70–99)
Potassium: 4.2 mmol/L (ref 3.5–5.1)
Sodium: 140 mmol/L (ref 135–145)

## 2021-12-26 LAB — POCT ACTIVATED CLOTTING TIME
Activated Clotting Time: 341 seconds
Activated Clotting Time: 311 seconds

## 2021-12-26 LAB — PREGNANCY, URINE: Preg Test, Ur: NEGATIVE

## 2021-12-26 SURGERY — ATRIAL FIBRILLATION ABLATION
Anesthesia: General

## 2021-12-26 MED ORDER — HEPARIN (PORCINE) IN NACL 1000-0.9 UT/500ML-% IV SOLN
INTRAVENOUS | Status: AC
  Filled 2021-12-26: qty 500

## 2021-12-26 MED ORDER — PROPOFOL 10 MG/ML IV BOLUS
INTRAVENOUS | Status: DC | PRN
Start: 2021-12-26 — End: 2021-12-26
  Administered 2021-12-26: 200 mg via INTRAVENOUS

## 2021-12-26 MED ORDER — PROTAMINE SULFATE 10 MG/ML IV SOLN
INTRAVENOUS | Status: DC | PRN
  Administered 2021-12-26: 35 mg via INTRAVENOUS

## 2021-12-26 MED ORDER — APIXABAN 5 MG PO TABS
5.0000 mg | ORAL_TABLET | Freq: Two times a day (BID) | ORAL | Status: DC
  Administered 2021-12-26: 5 mg via ORAL
  Filled 2021-12-26: qty 1

## 2021-12-26 MED ORDER — ROCURONIUM BROMIDE 10 MG/ML (PF) SYRINGE
PREFILLED_SYRINGE | INTRAVENOUS | Status: DC | PRN
Start: 2021-12-26 — End: 2021-12-26
  Administered 2021-12-26: 100 mg via INTRAVENOUS

## 2021-12-26 MED ORDER — MIDAZOLAM HCL 5 MG/5ML IJ SOLN
INTRAMUSCULAR | Status: DC | PRN
  Administered 2021-12-26: 2 mg via INTRAVENOUS

## 2021-12-26 MED ORDER — FENTANYL CITRATE (PF) 100 MCG/2ML IJ SOLN
INTRAMUSCULAR | Status: AC
  Filled 2021-12-26: qty 2

## 2021-12-26 MED ORDER — ONDANSETRON HCL 4 MG/2ML IJ SOLN: 4.0000 mg | Freq: Four times a day (QID) | INTRAMUSCULAR | Status: DC | PRN

## 2021-12-26 MED ORDER — HEPARIN (PORCINE) IN NACL 1000-0.9 UT/500ML-% IV SOLN
INTRAVENOUS | Status: DC | PRN
  Administered 2021-12-26 (×4): 500 mL

## 2021-12-26 MED ORDER — PANTOPRAZOLE SODIUM 40 MG PO TBEC
40.0000 mg | DELAYED_RELEASE_TABLET | Freq: Every day | ORAL | Status: DC
  Administered 2021-12-26: 40 mg via ORAL
  Filled 2021-12-26: qty 1

## 2021-12-26 MED ORDER — ACETAMINOPHEN 325 MG PO TABS
650.0000 mg | ORAL_TABLET | ORAL | Status: DC | PRN
  Filled 2021-12-26: qty 2

## 2021-12-26 MED ORDER — DEXAMETHASONE SODIUM PHOSPHATE 10 MG/ML IJ SOLN
INTRAMUSCULAR | Status: DC | PRN
  Administered 2021-12-26: 10 mg via INTRAVENOUS

## 2021-12-26 MED ORDER — SODIUM CHLORIDE 0.9% FLUSH: 3.0000 mL | INTRAVENOUS | Status: DC | PRN

## 2021-12-26 MED ORDER — ISOPROTERENOL HCL 0.2 MG/ML IJ SOLN
INTRAVENOUS | Status: DC | PRN
  Administered 2021-12-26: 2 ug/min via INTRAVENOUS

## 2021-12-26 MED ORDER — HEPARIN SODIUM (PORCINE) 1000 UNIT/ML IJ SOLN
INTRAMUSCULAR | Status: AC
  Filled 2021-12-26: qty 10

## 2021-12-26 MED ORDER — HEPARIN SODIUM (PORCINE) 1000 UNIT/ML IJ SOLN
INTRAMUSCULAR | Status: DC | PRN
  Administered 2021-12-26: 14000 [IU] via INTRAVENOUS
  Administered 2021-12-26: 3000 [IU] via INTRAVENOUS

## 2021-12-26 MED ORDER — ACETAMINOPHEN 500 MG PO TABS
1000.0000 mg | ORAL_TABLET | Freq: Once | ORAL | Status: AC
  Administered 2021-12-26: 1000 mg via ORAL
  Filled 2021-12-26: qty 2

## 2021-12-26 MED ORDER — SODIUM CHLORIDE 0.9 % IV SOLN: INTRAVENOUS | Status: DC

## 2021-12-26 MED ORDER — SODIUM CHLORIDE 0.9% FLUSH: 3.0000 mL | Freq: Two times a day (BID) | INTRAVENOUS | Status: DC

## 2021-12-26 MED ORDER — HEPARIN SODIUM (PORCINE) 1000 UNIT/ML IJ SOLN
INTRAMUSCULAR | Status: DC | PRN
Start: 2021-12-26 — End: 2021-12-26
  Administered 2021-12-26: 1000 [IU] via INTRAVENOUS

## 2021-12-26 MED ORDER — FENTANYL CITRATE (PF) 250 MCG/5ML IJ SOLN
INTRAMUSCULAR | Status: DC | PRN
  Administered 2021-12-26: 50 ug via INTRAVENOUS
  Administered 2021-12-26: 25 ug via INTRAVENOUS
  Administered 2021-12-26: 50 ug via INTRAVENOUS
  Administered 2021-12-26: 25 ug via INTRAVENOUS
  Administered 2021-12-26: 50 ug via INTRAVENOUS

## 2021-12-26 MED ORDER — SODIUM CHLORIDE 0.9 % IV SOLN: 250.0000 mL | INTRAVENOUS | Status: DC | PRN

## 2021-12-26 MED ORDER — LIDOCAINE 2% (20 MG/ML) 5 ML SYRINGE
INTRAMUSCULAR | Status: DC | PRN
Start: 2021-12-26 — End: 2021-12-26
  Administered 2021-12-26: 80 mg via INTRAVENOUS

## 2021-12-26 MED ORDER — SUGAMMADEX SODIUM 200 MG/2ML IV SOLN
INTRAVENOUS | Status: DC | PRN
  Administered 2021-12-26: 200 mg via INTRAVENOUS

## 2021-12-26 MED ORDER — PHENYLEPHRINE HCL-NACL 20-0.9 MG/250ML-% IV SOLN
INTRAVENOUS | Status: DC | PRN
  Administered 2021-12-26: 20 ug/min via INTRAVENOUS

## 2021-12-26 MED ORDER — HEPARIN (PORCINE) IN NACL 1000-0.9 UT/500ML-% IV SOLN
INTRAVENOUS | Status: AC
Start: 2021-12-26 — End: ?
  Filled 2021-12-26: qty 500

## 2021-12-26 MED ORDER — ISOPROTERENOL HCL 0.2 MG/ML IJ SOLN
INTRAMUSCULAR | Status: AC
Start: 2021-12-26 — End: ?
  Filled 2021-12-26: qty 5

## 2021-12-26 SURGICAL SUPPLY — 19 items
CATH 8FR REPROCESSED SOUNDSTAR (CATHETERS) ×2 IMPLANT
CATH 8FR SOUNDSTAR REPROCESSED (CATHETERS) IMPLANT
CATH JOSEPH QUAD ALLRED 6F REP (CATHETERS) ×1 IMPLANT
CATH OCTARAY 1.5 F (CATHETERS) ×1 IMPLANT
CATH S CIRCA THERM PROBE 10F (CATHETERS) ×1 IMPLANT
CATH SMTCH THERMOCOOL SF DF (CATHETERS) ×1 IMPLANT
CATH WEB BI DIR CSDF CRV REPRO (CATHETERS) ×1 IMPLANT
CLOSURE PERCLOSE PROSTYLE (VASCULAR PRODUCTS) ×3 IMPLANT
COVER SWIFTLINK CONNECTOR (BAG) ×2 IMPLANT
PACK EP LATEX FREE (CUSTOM PROCEDURE TRAY) ×2
PACK EP LF (CUSTOM PROCEDURE TRAY) ×1 IMPLANT
PAD DEFIB RADIO PHYSIO CONN (PAD) ×2 IMPLANT
PATCH CARTO3 (PAD) ×1 IMPLANT
SHEATH BAYLIS TRANSSEPTAL 98CM (NEEDLE) ×1 IMPLANT
SHEATH CARTO VIZIGO SM CVD (SHEATH) ×1 IMPLANT
SHEATH PINNACLE 8F 10CM (SHEATH) ×2 IMPLANT
SHEATH PINNACLE 9F 10CM (SHEATH) ×1 IMPLANT
SHEATH PROBE COVER 6X72 (BAG) ×1 IMPLANT
TUBING SMART ABLATE COOLFLOW (TUBING) ×1 IMPLANT

## 2021-12-26 NOTE — Anesthesia Postprocedure Evaluation (Signed)
Anesthesia Post Note  Patient: Katie Williams  Procedure(s) Performed: ATRIAL FIBRILLATION ABLATION     Patient location during evaluation: PACU Anesthesia Type: General Level of consciousness: awake Pain management: pain level controlled Vital Signs Assessment: post-procedure vital signs reviewed and stable Respiratory status: spontaneous breathing Cardiovascular status: stable Postop Assessment: no apparent nausea or vomiting Anesthetic complications: no   No notable events documented.  Last Vitals:  Vitals:   12/26/21 1157  BP: (!) 139/96  Pulse: 72  Resp: 16  Temp: 37.2 C  SpO2: 98%    Last Pain:  Vitals:   12/26/21 1335  TempSrc:   PainSc: 0-No pain                 Shiva Karis

## 2021-12-26 NOTE — Anesthesia Preprocedure Evaluation (Addendum)
Anesthesia Evaluation  Patient identified by MRN, date of birth, ID band Patient awake    Reviewed: Allergy & Precautions, NPO status , Patient's Chart, lab work & pertinent test results  History of Anesthesia Complications Negative for: history of anesthetic complications  Airway Mallampati: I  TM Distance: >3 FB Neck ROM: Full    Dental  (+) Teeth Intact, Dental Advisory Given   Pulmonary sleep apnea and Continuous Positive Airway Pressure Ventilation ,    breath sounds clear to auscultation       Cardiovascular hypertension, Pt. on medications (-) angina+ dysrhythmias Atrial Fibrillation  Rhythm:Irregular Rate:Normal  03/2021 ECHO: EF 60-65%. The LV has normal function, no regional wall motion abnormalities. There is mild asymmetric left ventricular hypertrophy of the posterior segment, no significant valvular abnormalities   Neuro/Psych Anxiety Depression negative neurological ROS     GI/Hepatic Neg liver ROS, GERD  Medicated and Controlled,  Endo/Other  negative endocrine ROS  Renal/GU negative Renal ROS     Musculoskeletal   Abdominal   Peds  Hematology eliquis   Anesthesia Other Findings   Reproductive/Obstetrics                            Anesthesia Physical Anesthesia Plan  ASA: 3  Anesthesia Plan: General   Post-op Pain Management: Tylenol PO (pre-op)* and Minimal or no pain anticipated   Induction:   PONV Risk Score and Plan: 3 and Ondansetron, Dexamethasone and Scopolamine patch - Pre-op  Airway Management Planned: Oral ETT  Additional Equipment: None  Intra-op Plan:   Post-operative Plan: Extubation in OR  Informed Consent: I have reviewed the patients History and Physical, chart, labs and discussed the procedure including the risks, benefits and alternatives for the proposed anesthesia with the patient or authorized representative who has indicated his/her  understanding and acceptance.     Dental advisory given  Plan Discussed with: CRNA and Surgeon  Anesthesia Plan Comments:        Anesthesia Quick Evaluation

## 2021-12-26 NOTE — Discharge Instructions (Addendum)
Post procedure care instructions No driving for 4 days. No lifting over 5 lbs for 1 week. No vigorous or sexual activity for 1 week. You may return to work/your usual activities on 01/03/22. Keep procedure site clean & dry. If you notice increased pain, swelling, bleeding or pus, call/return!  You may shower after 24 hours, but no soaking in baths/hot tubs/pools for 1 week.    You have an appointment set up with the [facility].  Multiple studies have shown that being followed by a dedicated atrial fibrillation clinic in addition to the standard care you receive from your other physicians improves health. We believe that enrollment in the atrial fibrillation clinic will allow [place] to better care for you.   The phone number to the [facility] is [phone]. The clinic is staffed Monday through Friday from 8:30am to 5pm.  Parking Directions: The clinic is located in the Heart and Vascular Building connected to [facility]. 1)From [address] turn on to [address] and go to the 3rd entrance  (Heart and Vascular entrance) on the right. 2)Look to the right for Heart &Vascular Parking Garage. 3)A code for the entrance is required, for March is 1102   4)Take the elevators to the 1st floor. Registration is in the room with the glass walls at the end of the hallway.  If you have any trouble parking or locating the clinic, please dont hesitate to call [phone].    Cardiac Ablation, Care After  This sheet gives you information about how to care for yourself after your procedure. Your health care provider may also give you more specific instructions. If you have problems or questions, contact your health care provider. What can I expect after the procedure? After the procedure, it is common to have: Bruising around your puncture site. Tenderness around your puncture site. Skipped heartbeats. Tiredness (fatigue).  Follow these instructions at  home: Puncture site care  Follow instructions from your health care provider about how to take care of your puncture site. Make sure you: If present, leave stitches (sutures), skin glue, or adhesive strips in place. These skin closures may need to stay in place for up to 2 weeks. If adhesive strip edges start to loosen and curl up, you may trim the loose edges. Do not remove adhesive strips completely unless your health care provider tells you to do that. If a large square bandage is present, this may be removed 24 hours after surgery.  Check your puncture site every day for signs of infection. Check for: Redness, swelling, or pain. Fluid or blood. If your puncture site starts to bleed, lie down on your back, apply firm pressure to the area, and contact your health care provider. Warmth. Pus or a bad smell. Driving Do not drive for at least 4 days after your procedure or however long your health care provider recommends. (Do not resume driving if you have previously been instructed not to drive for other health reasons.) Do not drive or use heavy machinery while taking prescription pain medicine. Activity Avoid activities that take a lot of effort for at least 7 days after your procedure. Do not lift anything that is heavier than 5 lb (4.5 kg) for one week.  No sexual activity for 1 week.  Return to your normal activities as told by your health care provider. Ask your health care provider what activities are safe for you. General instructions Take over-the-counter and prescription medicines only as told by your health  care provider. Do not use any products that contain nicotine or tobacco, such as cigarettes and e-cigarettes. If you need help quitting, ask your health care provider. You may shower after 24 hours, but Do not take baths, swim, or use a hot tub for 1 week.  Do not drink alcohol for 24 hours after your procedure. Keep all follow-up visits as told by your health care provider. This  is important. Contact a health care provider if: You have redness, mild swelling, or pain around your puncture site. You have fluid or blood coming from your puncture site that stops after applying firm pressure to the area. Your puncture site feels warm to the touch. You have pus or a bad smell coming from your puncture site. You have a fever. You have chest pain or discomfort that spreads to your neck, jaw, or arm. You are sweating a lot. You feel nauseous. You have a fast or irregular heartbeat. You have shortness of breath. You are dizzy or light-headed and feel the need to lie down. You have pain or numbness in the arm or leg closest to your puncture site. Get help right away if: Your puncture site suddenly swells. Your puncture site is bleeding and the bleeding does not stop after applying firm pressure to the area. These symptoms may represent a serious problem that is an emergency. Do not wait to see if the symptoms will go away. Get medical help right away. Call your local emergency services (911 in the [place] Do not drive yourself to the hospital. Summary After the procedure, it is normal to have bruising and tenderness at the puncture site in your groin, neck, or forearm. Check your puncture site every day for signs of infection. Get help right away if your puncture site is bleeding and the bleeding does not stop after applying firm pressure to the area. This is a medical emergency. This information is not intended to replace advice given to you by your health care provider. Make sure you discuss any questions you have with your health care provider.

## 2021-12-26 NOTE — Anesthesia Procedure Notes (Signed)
Procedure Name: Intubation Date/Time: 12/26/2021 1:46 PM Performed by: Kyung Rudd, CRNA Pre-anesthesia Checklist: Patient identified, Emergency Drugs available, Suction available and Patient being monitored Patient Re-evaluated:Patient Re-evaluated prior to induction Oxygen Delivery Method: Circle system utilized Preoxygenation: Pre-oxygenation with 100% oxygen Induction Type: IV induction Ventilation: Mask ventilation without difficulty Laryngoscope Size: Mac and 3 Grade View: Grade I Tube type: Oral Tube size: 7.0 mm Number of attempts: 1 Airway Equipment and Method: Stylet Placement Confirmation: ETT inserted through vocal cords under direct vision, positive ETCO2 and breath sounds checked- equal and bilateral Secured at: 21 cm Tube secured with: Tape Dental Injury: Teeth and Oropharynx as per pre-operative assessment

## 2021-12-26 NOTE — Transfer of Care (Signed)
Immediate Anesthesia Transfer of Care Note  Patient: Katie Williams  Procedure(s) Performed: ATRIAL FIBRILLATION ABLATION  Patient Location: Cath Lab  Anesthesia Type:General  Level of Consciousness: awake, alert , oriented and patient cooperative  Airway & Oxygen Therapy: Patient Spontanous Breathing and Patient connected to nasal cannula oxygen  Post-op Assessment: Report given to RN, Post -op Vital signs reviewed and stable and Patient moving all extremities  Post vital signs: Reviewed and stable  Last Vitals:  Vitals Value Taken Time  BP    Temp    Pulse    Resp    SpO2      Last Pain:  Vitals:   12/26/21 1335  TempSrc:   PainSc: 0-No pain         Complications: No notable events documented.

## 2021-12-26 NOTE — H&P (Signed)
Electrophysiology Office Note:     Date:  12/26/2021    ID:  Katie Williams, DOB Dec 18, 1965, MRN [id number]   PCP:  Waldemar Dickens, MD            [facility] Cardiologist:  None  [facility] Electrophysiologist:  Vickie Epley, MD    Referring MD: Oliver Barre, PA    Chief Complaint: AF and ? preexcitation   History of Present Illness:     Katie Williams is a 56 y.o. female who presents for an evaluation of AF and possible preexcitation at the request of Adline Peals, PA-C. Their medical history includes AF, HTN, OSA on CPAP and anxiety. She was seen by [facility] on 09/30/2021 for pAF. She was previously started on a BB but this actually made her AF symptoms worse so it was stopped. She has a CHADSVASc of 2 and is not currently on an anticoagulant.    The patient confirms the above.  She continues to have highly symptomatic episodes of atrial fibrillation.  They have become very frequent over the last few weeks.  They occur almost daily.  They last hours at a time without a clear trigger.   She has sleep apnea and has been using a CPAP machine for 3 weeks.  She is working on increasing her exercise and losing weight.    Plan for PVI and CTI ablation today.   Objective        Past Medical History:  Diagnosis Date   Anxiety and depression     Atrial fibrillation ([place]) 03/04/2021   Cataract     COVID-19     Hypertension     Insomnia     Low vitamin D level     Sleep apnea     Tubular adenoma of colon 2014           Past Surgical History:  Procedure Laterality Date   COLONOSCOPY   last 01/01/2016   no prior surgery       POLYPECTOMY          Current Medications: Active Medications      Current Meds  Medication Sig   ASPIRIN PO Takes 325mg  chewable by mouth as needed for A-fib episodes   estradiol (ESTRACE) 0.1 MG/GM vaginal cream Insert 1 gram twice weekly vaginally   hydrochlorothiazide (HYDRODIURIL) 25 MG tablet Take 25 mg by mouth daily.    Norethindrone Acetate-Ethinyl Estrad-FE (LOESTRIN 24 FE) 1-20 MG-MCG(24) tablet Take 1 tablet by mouth daily.   pantoprazole (PROTONIX) 40 MG tablet Take 1 tablet (40 mg total) by mouth daily.   VUITY 1.25 % SOLN Apply 1 drop to eye daily.        Allergies:   Patient has no known allergies.    Social History         Socioeconomic History   Marital status: Married      Spouse name: Not on file   Number of children: Not on file   Years of education: Not on file   Highest education level: Not on file  Occupational History   Not on file  Tobacco Use   Smoking status: Never   Smokeless tobacco: Never  Vaping Use   Vaping Use: Never used  Substance and Sexual Activity   Alcohol use: Yes      Alcohol/week: 6.0 - 12.0 standard drinks      Types: 6 - 12 Glasses of wine per week      Comment: 2-3  glasses a sitting drinks 3-4 days out of week 09/17/2021   Drug use: No   Sexual activity: Yes      Partners: Male      Birth control/protection: Pill      Comment: 1st intercourse- 27, partners- refursed,  married- 83 yrs   Other Topics Concern   Not on file  Social History Narrative   Not on file    Social Determinants of Health    Financial Resource Strain: Not on file  Food Insecurity: Not on file  Transportation Needs: Not on file  Physical Activity: Not on file  Stress: Not on file  Social Connections: Not on file      Family History: The patient's family history includes Colon cancer (age of onset: 6) in her paternal grandfather; Colon cancer (age of onset: 67) in her paternal grandmother; Colon cancer (age of onset: 35) in her father; Colon polyps (age of onset: 19) in her brother; Hypertension in her father and mother. There is no history of Stomach cancer, Esophageal cancer, or Rectal cancer.   ROS:   Please see the history of present illness.    All other systems reviewed and are negative.   EKGs/Labs/Other Studies Reviewed:     The following studies were reviewed  today:   04/01/2021 Echo LV normal RV normal No MR   09/30/2021 ECG shows  ectopic atrial rhyth vs junctional rhythm 09/17/2021 ECG shows sinus rhythm with short PR. No obvious preexcitation   EKG:  The ekg ordered today demonstrates sinus rhythm.  No definite preexcitation.  The PR interval is 130 ms.     Recent Labs: 03/02/2021: ALT 19; BUN 18; Creat 1.29; Hemoglobin 15.5; Platelets 269; Potassium 4.4; Sodium 137; TSH 1.69  Recent Lipid Panel Labs (Brief)  No results found for: CHOL, TRIG, HDL, CHOLHDL, VLDL, LDLCALC, LDLDIRECT     Physical Exam:     VS:  BP 132/90    Pulse 82    Ht 5\' 9"  (1.753 m)    Wt 202 lb 6.4 oz (91.8 kg)    SpO2 97%    BMI 29.89 kg/m         Wt Readings from Last 3 Encounters:  11/07/21 202 lb 6.4 oz (91.8 kg)  10/21/21 196 lb (88.9 kg)  09/30/21 198 lb 6.4 oz (90 kg)      GEN:  Well nourished, well developed in no acute distress HEENT: Normal NECK: No JVD; No carotid bruits LYMPHATICS: No lymphadenopathy CARDIAC: RRR, no murmurs, rubs, gallops RESPIRATORY:  Clear to auscultation without rales, wheezing or rhonchi  ABDOMEN: Soft, non-tender, non-distended MUSCULOSKELETAL:  No edema; No deformity  SKIN: Warm and dry NEUROLOGIC:  Alert and oriented x 3 PSYCHIATRIC:  Normal affect          Assessment     ASSESSMENT:     1. Atrial fibrillation with RVR (HCC)   2. Paroxysmal atrial fibrillation (HCC)   3. Junctional rhythm     PLAN:     In order of problems listed above:   #Paroxysmal atrial fibrillation Highly symptomatic and increasing frequency of episodes.  We discussed treatment options including antiarrhythmic drug therapy or catheter ablation.  I do think she is a good candidate for catheter ablation.  She would like to proceed.  She is currently not taking anticoagulant.  I will like her to start Eliquis at least 4 weeks prior to the ablation procedure.  We will plan to continue it for at least 6 months after  the ablation  procedure.  During the ablation, I would like to perform a full EP study with Isopril to confirm no accessory pathway or other inducible arrhythmias given her history of a junctional rhythm and short PR interval. I discussed the catheter ablation procedure in detail include the risk, recovery and likelihood of success and she elects to proceed.   Risk, benefits, and alternatives to EP study and radiofrequency ablation for afib were also discussed in detail today. These risks include but are not limited to stroke, bleeding, vascular damage, tamponade, perforation, damage to the esophagus, lungs, and other structures, pulmonary vein stenosis, worsening renal function, and death. The patient understands these risk and wishes to proceed.  We will therefore proceed with catheter ablation at the next available time.  Carto, ICE, anesthesia are requested for the procedure.  Will also obtain CT PV protocol prior to the procedure to exclude LAA thrombus and further evaluate atrial anatomy.     #Obstructive sleep apnea Encourage continued CPAP use  #Overweight Encouraged weight loss.  Discussed link between overweight and A. fib ablation success rates.       Total time spent with patient today 65 minutes. This includes reviewing records, evaluating the patient and coordinating care.   Medication Adjustments/Labs and Tests Ordered: Current medicines are reviewed at length with the patient today.  Concerns regarding medicines are outlined above.  No orders of the defined types were placed in this encounter.   No orders of the defined types were placed in this encounter.       Signed, Hilton Cork. Quentin Ore, MD, [facility], [facility] 11/07/2021 11:27 AM    Electrophysiology [facility]     --------------------------------  I have seen, examined the patient, and reviewed the above assessment and plan.    Plan for PVI and CTI ablation. On eliquis. Procedure reviewed.   Vickie Epley, MD 12/26/2021 12:48 PM

## 2021-12-27 ENCOUNTER — Encounter (HOSPITAL_COMMUNITY): Payer: Self-pay | Admitting: Cardiology

## 2021-12-27 LAB — POCT ACTIVATED CLOTTING TIME: Activated Clotting Time: 420 seconds

## 2021-12-29 ENCOUNTER — Telehealth: Payer: Self-pay | Admitting: Internal Medicine

## 2021-12-29 NOTE — Telephone Encounter (Signed)
I was called by the patient's husband overnight to alert me that the patient was having severe chest pain that has been worsening since the procedure. They were concerned but also noted they had no medications for discomfort.  I recommended initial trial of high dose tylenol for the next 24 hours to see if that provides some relief. If it worsens more, recommended just c oming to the emergency department for evaluation. If subsides but still present then call back for further recs.  I let the patient and husband know I would route this message to provider who did ablation.   Billey Chang, MD  Cardiology fellow

## 2021-12-30 ENCOUNTER — Telehealth: Payer: Self-pay | Admitting: Cardiology

## 2021-12-30 ENCOUNTER — Encounter: Payer: Self-pay | Admitting: Cardiology

## 2021-12-30 NOTE — Telephone Encounter (Signed)
Patient extremely dizzy and near syncopal prior to calling. Heart rates are between 90-190 BP is as low 64/40 up to 113/86. Instructed patient to go to nearest emergency room as patient continues being dizzy even sitting. Patient verbalized understanding and will proceed to ER for further evaluation. Pt does state her CP from the weekend has mostly resolved with tylenol. Pt will call for follow up once treated in ER for acute issue.

## 2021-12-30 NOTE — Telephone Encounter (Signed)
Pt calling into to report she went into AFib around 9am last night.  She awoke this morning still in AFib and reports currently still out of rhythm. She woke up at 5 am and went into the kitchen for a drink where her legs "just went out" and she fell to ground on her left hip, but she reports that she did not completely pass out nor hit her head. She states she had felt "unsteady" since last night.  Last night went into AFib, about 9 am  Woke up still in AFib Could feel myself being unsteady Woke up at 5 am and near syncope, legs just went out [place] on left hip, did not hit head HR ranging from 40s - 190s. Currently racing. Unable to check BP as cuff has not been working but will keep trying and send reading through mychart if she gets its working. She is laying down, and will not ambulate without assistance per my instruction.  Aware that I spoke with Dr. Mardene Speak nurse who recommends forwarding this to the AFib clinic to address. Pt reports that she has put a call into that office. Aware I will forward this note to them.

## 2021-12-30 NOTE — Telephone Encounter (Signed)
Successful phone call with patient.  She tells me her chest pain has improved. Did have episode of rapid AF that has now resolved following the ablation. Also felt weak and had a passing out episode after she got up and walked into her kitchen. She had a prodrome.   I have recommended she take ibuprofen 600mg  PO BID with food for 2 days. I have also recommended she stay adequately hydrated and take all changes in position slowly to avoid orthostatic hypotension.  Lysbeth Galas T. Quentin Ore, MD, [facility], [facility] Cardiac Electrophysiology

## 2021-12-30 NOTE — Telephone Encounter (Signed)
Called patient to discuss pain post ablation. No answer x 2.   Lysbeth Galas T. Quentin Ore, MD, [facility], [facility] Cardiac Electrophysiology

## 2021-12-30 NOTE — Telephone Encounter (Signed)
Pt states that after ablation she as of today is in afib, has a fast heart rate, dizziness and a syncope episode today... please advise

## 2022-01-23 ENCOUNTER — Ambulatory Visit (HOSPITAL_COMMUNITY): Payer: 59 | Admitting: Physician Assistant

## 2022-01-28 ENCOUNTER — Ambulatory Visit (HOSPITAL_COMMUNITY)
Admission: RE | Admit: 2022-01-28 | Discharge: 2022-01-28 | Disposition: A | Discharge status: Home / Self Care | Payer: 59 | Source: Ambulatory Visit | Attending: Physician Assistant | Admitting: Physician Assistant

## 2022-01-28 ENCOUNTER — Other Ambulatory Visit: Payer: Self-pay

## 2022-01-28 ENCOUNTER — Encounter (HOSPITAL_COMMUNITY): Payer: Self-pay | Admitting: Physician Assistant

## 2022-01-28 VITALS — BP 118/90 | HR 97 | Ht 68.0 in | Wt 200.0 lb

## 2022-01-28 DIAGNOSIS — I483 Typical atrial flutter: Secondary | ICD-10-CM | POA: Insufficient documentation

## 2022-01-28 DIAGNOSIS — Z79899 Other long term (current) drug therapy: Secondary | ICD-10-CM | POA: Diagnosis not present

## 2022-01-28 DIAGNOSIS — I480 Paroxysmal atrial fibrillation: Secondary | ICD-10-CM | POA: Diagnosis not present

## 2022-01-28 DIAGNOSIS — Z7901 Long term (current) use of anticoagulants: Secondary | ICD-10-CM | POA: Diagnosis not present

## 2022-01-28 DIAGNOSIS — G4733 Obstructive sleep apnea (adult) (pediatric): Secondary | ICD-10-CM | POA: Insufficient documentation

## 2022-01-28 DIAGNOSIS — I10 Essential (primary) hypertension: Secondary | ICD-10-CM | POA: Insufficient documentation

## 2022-01-28 NOTE — Progress Notes (Signed)
? ? ?Primary Care Physician: Waldemar Dickens, MD ?Primary Cardiologist: none ?Primary Electrophysiologist: Dr Quentin Ore ?Referring Physician: Dr Marily Memos ? ? ?Katie Williams is a 56 y.o. female with a history of ascending aorta dilatation, HTN, OSA, atrial fibrillation who presents for follow up in the [facility].  The patient was initially diagnosed with atrial fibrillation on her smart watch with symptoms of tachypalpitations and chest discomfort on 03/01/21. The episode resolved by the time she sought care the following day. Patient has a CHADS2VASC score of 2. She brings in her Apple Watch strips which do show true afib (personally reviewed). She has had 3 additional episodes. She drinks ~4 glasses of wine per week and does admit to snoring and daytime somnolence. Started on metoprolol 09/17/21. ? ?On follow up today, patient is s/p afib and flutter ablation with Dr Quentin Ore on 12/26/21. She did have significant chest discomfort post ablation and was started on ibuprofen which resolved the symptoms. She has continued to have frequent symptoms of tachypalpitations, several days per week, sometimes multiple times per day. These episodes can be brief or a couple hours. Apple Watch strips reviewed in office today which do show afib with RVR and SR. She denies CP, swallowing pain, or groin issues currently. No bleeding issues on anticoagulation. She is compliant with her CPAP. ? ?Today, she denies symptoms of shortness of breath, orthopnea, PND, lower extremity edema, dizziness, presyncope, syncope, bleeding, or neurologic sequela. The patient is tolerating medications without difficulties and is otherwise without complaint today.  ? ? ?Atrial Fibrillation Risk Factors: ? ?she does have symptoms or diagnosis of sleep apnea. ?she is compliant with CPAP therapy.  ?she does not have a history of rheumatic fever. ?she does have a history of alcohol use. ?The patient does not have a history of  early familial atrial fibrillation or other arrhythmias. ? ?she has a BMI of Body mass index is 30.41 kg/m?Marland KitchenMarland Kitchen ?Filed Weights  ? 01/28/22 1514  ?Weight: 90.7 kg  ? ? ? ?Family History  ?Problem Relation Age of Onset  ? Colon cancer Father 65  ? Hypertension Father   ? Colon cancer Paternal Grandmother 80  ? Colon cancer Paternal Grandfather 60  ? Colon polyps Brother 28  ?     precancerous polyps  ? Hypertension Mother   ? Stomach cancer Neg Hx   ? Esophageal cancer Neg Hx   ? Rectal cancer Neg Hx   ? ? ? ?Atrial Fibrillation Management history: ? ?Previous antiarrhythmic drugs: none ?Previous cardioversions: none ?Previous ablations: 12/26/21 ?CHADS2VASC score: 2 ?Anticoagulation history: Eliquis ? ? ?Past Medical History:  ?Diagnosis Date  ? Anxiety and depression   ? Atrial fibrillation ([place]) 03/04/2021  ? Cataract   ? COVID-19   ? Hypertension   ? Insomnia   ? Low vitamin D level   ? Sleep apnea   ? Tubular adenoma of colon 2014  ? ?Past Surgical History:  ?Procedure Laterality Date  ? ATRIAL FIBRILLATION ABLATION N/A 12/26/2021  ? Procedure: ATRIAL FIBRILLATION ABLATION;  Surgeon: Vickie Epley, MD;  Location: [facility];  Service: Cardiovascular;  Laterality: N/A;  ? COLONOSCOPY  last 01/01/2016  ? no prior surgery    ? POLYPECTOMY    ? ? ?Current Outpatient Medications  ?Medication Sig Dispense Refill  ? apixaban (ELIQUIS) 5 MG TABS tablet Take 1 tablet (5 mg total) by mouth 2 (two) times daily. 60 tablet 11  ? estradiol (ESTRACE) 0.1 MG/GM vaginal cream  Insert 1 gram twice weekly vaginally 42.5 g 4  ? hydrochlorothiazide (HYDRODIURIL) 25 MG tablet Take 25 mg by mouth daily.    ? Norethindrone Acetate-Ethinyl Estrad-FE (LOESTRIN 24 FE) 1-20 MG-MCG(24) tablet Take 1 tablet by mouth daily. 84 tablet 4  ? pantoprazole (PROTONIX) 40 MG tablet Take 1 tablet (40 mg total) by mouth daily. 90 tablet 3  ? ?No current facility-administered medications for this encounter.  ? ? ?No Known Allergies ? ?Social  History  ? ?Socioeconomic History  ? Marital status: Married  ?  Spouse name: Not on file  ? Number of children: Not on file  ? Years of education: Not on file  ? Highest education level: Not on file  ?Occupational History  ? Not on file  ?Tobacco Use  ? Smoking status: Never  ? Smokeless tobacco: Never  ? Tobacco comments:  ?  Never smoke 01/28/2022  ?Vaping Use  ? Vaping Use: Never used  ?Substance and Sexual Activity  ? Alcohol use: Yes  ?  Alcohol/week: 6.0 - 12.0 standard drinks  ?  Types: 6 - 12 Glasses of wine per week  ?  Comment: 2-3 glasses a sitting drinks 3-4 days out of week 09/17/2021  ? Drug use: No  ? Sexual activity: Yes  ?  Partners: Male  ?  Birth control/protection: Pill  ?  Comment: 1st intercourse- 20, partners- refursed,  married- 35 yrs   ?Other Topics Concern  ? Not on file  ?Social History Narrative  ? Not on file  ? ?Social Determinants of Health  ? ?Financial Resource Strain: Not on file  ?Food Insecurity: Not on file  ?Transportation Needs: Not on file  ?Physical Activity: Not on file  ?Stress: Not on file  ?Social Connections: Not on file  ?Intimate Partner Violence: Not on file  ? ? ? ?ROS- All systems are reviewed and negative except as per the HPI above. ? ?Physical Exam: ?Vitals:  ? 01/28/22 1514  ?BP: 118/90  ?Pulse: 97  ?Weight: 90.7 kg  ?Height: '5\' 8"'$  (1.727 m)  ? ? ?GEN- The patient is a well appearing obese female, alert and oriented x 3 today.   ?HEENT-head normocephalic, atraumatic, sclera clear, conjunctiva pink, hearing intact, trachea midline. ?Lungs- Clear to ausculation bilaterally, normal work of breathing ?Heart- Regular rate and rhythm, no murmurs, rubs or gallops  ?GI- soft, NT, ND, + BS ?Extremities- no clubbing, cyanosis, or edema ?MS- no significant deformity or atrophy ?Skin- no rash or lesion ?Psych- euthymic mood, full affect ?Neuro- strength and sensation are intact ? ? ?Wt Readings from Last 3 Encounters:  ?01/28/22 90.7 kg  ?12/26/21 89.4 kg  ?11/07/21 91.8  kg  ? ? ?EKG today demonstrates  ?Ectopic atrial rhythm (similar to ECG 09/30/21) ?Vent. rate 97 BPM ?PR interval 120 ms ?QRS duration 100 ms ?QT/QTcB 362/459 ms ? ?Echo 04/01/21 demonstrated  ? 1. Left ventricular ejection fraction, by estimation, is 60 to 65%. The  ?left ventricle has normal function. The left ventricle has no regional  ?wall motion abnormalities. There is mild asymmetric left ventricular  ?hypertrophy of the posterior segment. Left ventricular diastolic parameters are indeterminate.  ? 2. Right ventricular systolic function is normal. The right ventricular  ?size is normal.  ? 3. The mitral valve is normal in structure. No evidence of mitral valve  ?regurgitation. No evidence of mitral stenosis.  ? 4. The aortic valve is normal in structure. Aortic valve regurgitation is  ?not visualized. No aortic stenosis is present.  ?  5. Aortic dilatation noted. There is mild dilatation of the ascending  ?aorta, measuring 37 mm.  ? 6. The inferior vena cava is normal in size with greater than 50%  ?respiratory variability, suggesting right atrial pressure of 3 mmHg.  ? ?Epic records are reviewed at length today ? ?CHA2DS2-VASc Score = 2  ?The patient's score is based upon: ?CHF History: 0 ?HTN History: 1 ?Diabetes History: 0 ?Stroke History: 0 ?Vascular Disease History: 0 ?Age Score: 0 ?Gender Score: 1 ?    ? ? ?ASSESSMENT AND PLAN: ?1. Paroxysmal Atrial Fibrillation/typical atrial flutter ?The patient's CHA2DS2-VASc score is 2, indicating a 2.2% annual risk of stroke.   ?S/p afib and flutter ablation 12/26/21 ?Patient continues to have frequent episodes of afib. She reports good compliance with her CPAP. Recall, she felt worse on metoprolol. We discussed starting diltiazem, diltiazem + flecainide, or Multaq. After reviewing the risks and benefits, will start Multaq 400 mg BID. Will have her return next week for ECG.  ?Continue Eliquis 5 mg BID with no missed doses for 3 months post ablation.  ?Apple watch for  home monitoring.  ?We also briefly discussed the possibility of repeat ablation if she continues to have frequent afib.  ? ?2. OSA ?Patient reports compliance with CPAP therapy. ? ?3. HTN ?Stable, no chang

## 2022-01-29 ENCOUNTER — Other Ambulatory Visit (HOSPITAL_COMMUNITY): Payer: Self-pay

## 2022-01-29 MED ORDER — DRONEDARONE HCL 400 MG PO TABS
400.0000 mg | ORAL_TABLET | Freq: Two times a day (BID) | ORAL | 1 refills | Status: DC
Start: 2022-01-29 — End: 2022-02-20

## 2022-01-31 ENCOUNTER — Telehealth: Payer: Self-pay | Admitting: Cardiology

## 2022-01-31 NOTE — Telephone Encounter (Signed)
Office called because the patient is going through depression state. Dr. Marily Memos wanted to make sure it would be okay to provide the patient with. either Prozac, zoloft, or wellbutrin want to make sure that Dr. Quentin Ore will be okay with either. ?

## 2022-01-31 NOTE — Telephone Encounter (Signed)
Katie Williams with the PCP office that the only medication the patient could take out of the ones listed is Wellbutrin because of the cardiac meds she is currently on. This is per PharmD.  ?Verbalized understanding.  ?

## 2022-02-04 ENCOUNTER — Other Ambulatory Visit: Payer: Self-pay

## 2022-02-04 ENCOUNTER — Ambulatory Visit (HOSPITAL_COMMUNITY)
Admission: RE | Admit: 2022-02-04 | Discharge: 2022-02-04 | Disposition: A | Discharge status: Home / Self Care | Payer: 59 | Source: Ambulatory Visit | Attending: Physician Assistant | Admitting: Physician Assistant

## 2022-02-04 DIAGNOSIS — Z79899 Other long term (current) drug therapy: Secondary | ICD-10-CM | POA: Diagnosis not present

## 2022-02-04 DIAGNOSIS — I4891 Unspecified atrial fibrillation: Secondary | ICD-10-CM | POA: Insufficient documentation

## 2022-02-04 DIAGNOSIS — R9431 Abnormal electrocardiogram [ECG] [EKG]: Secondary | ICD-10-CM | POA: Diagnosis not present

## 2022-02-04 DIAGNOSIS — I480 Paroxysmal atrial fibrillation: Secondary | ICD-10-CM

## 2022-02-04 NOTE — Progress Notes (Signed)
Patient returns for ECG after starting Multaq. ECG shows SR, short PR 102, QRS 118, QTc 463. She states she did have some GI side effects at first but this has resolved. She denies any episodes of afib since starting the medication. F/u with Dr Quentin Ore as scheduled.  ?

## 2022-02-20 ENCOUNTER — Other Ambulatory Visit (HOSPITAL_COMMUNITY): Payer: Self-pay | Admitting: Physician Assistant

## 2022-03-25 ENCOUNTER — Ambulatory Visit: Payer: 59 | Admitting: Cardiology

## 2022-03-25 ENCOUNTER — Encounter: Payer: Self-pay | Admitting: *Deleted

## 2022-03-25 ENCOUNTER — Encounter: Payer: Self-pay | Admitting: Cardiology

## 2022-03-25 VITALS — BP 116/76 | HR 92 | Ht 68.0 in | Wt 196.4 lb

## 2022-03-25 DIAGNOSIS — I4819 Other persistent atrial fibrillation: Secondary | ICD-10-CM | POA: Diagnosis not present

## 2022-03-25 DIAGNOSIS — Z9989 Dependence on other enabling machines and devices: Secondary | ICD-10-CM

## 2022-03-25 DIAGNOSIS — Z01812 Encounter for preprocedural laboratory examination: Secondary | ICD-10-CM | POA: Diagnosis not present

## 2022-03-25 DIAGNOSIS — I480 Paroxysmal atrial fibrillation: Secondary | ICD-10-CM

## 2022-03-25 DIAGNOSIS — G4733 Obstructive sleep apnea (adult) (pediatric): Secondary | ICD-10-CM

## 2022-03-25 NOTE — Patient Instructions (Signed)
Medication Instructions:  ?Your physician recommends that you continue on your current medications as directed. Please refer to the Current Medication list given to you today. ? ?*If you need a refill on your cardiac medications before your next appointment, please call your pharmacy* ? ? ?Lab Work: ?Pre procedure labs -- see procedure instruction letter:  BMP & CBC ? ?If you have labs (blood work) drawn today and your tests are completely normal, you will receive your results only by: ?MyChart Message (if you have MyChart) OR ?A paper copy in the mail ?If you have any lab test that is abnormal or we need to change your treatment, we will call you to review the results. ? ? ?Testing/Procedures: ?Your physician has requested that you have repeat cardiac CT within 7 days PRIOR to your ablation. Cardiac computed tomography (CT) is a painless test that uses an x-ray machine to take clear, detailed pictures of your heart.  Please follow instruction below located under "other instructions". ?You will get a call from our office to schedule the date for this test. ? ?Your physician has recommended that you have a repeat ablation. Catheter ablation is a medical procedure used to treat some cardiac arrhythmias (irregular heartbeats). During catheter ablation, a long, thin, flexible tube is put into a blood vessel in your groin (upper thigh), or neck. This tube is called an ablation catheter. It is then guided to your heart through the blood vessel. Radio frequency waves destroy small areas of heart tissue where abnormal heartbeats may cause an arrhythmia to start. Please follow instruction letter given to you today. ? ? ?Follow-Up: ?At [facility], you and your health needs are our priority.  As part of our continuing mission to provide you with exceptional heart care, we have created designated Provider Care Teams.  These Care Teams include your primary Cardiologist (physician) and Advanced Practice Providers (APPs -   Physician Assistants and Nurse Practitioners) who all work together to provide you with the care you need, when you need it. ? ?Your next appointment:   ?1 month(s) after your ablation ? ?The format for your next appointment:   ?In Person ? ?Provider:   ?AFib clinic ? ? ?Thank you for choosing [facility]!! ? ? ?Trinidad Curet, RN ?([phone] ? ? ? ?Other Instructions ? ? ? ? ? ? ?

## 2022-03-25 NOTE — Progress Notes (Signed)
?Electrophysiology Office Follow up Visit Note:   ? ?Date:  03/25/2022  ? ?ID:  Katie Williams, DOB 06/26/1966, MRN [id number] ? ?PCP:  Waldemar Dickens, MD  ?[facility] Cardiologist:  None  ?[facility] Electrophysiologist:  Vickie Epley, MD  ? ? ?Interval History:   ? ?Katie Williams is a 56 y.o. female who presents for a follow up visit. They were last seen in clinic 11/07/2021. ? ?Since their last appointment, they underwent atrial fibrillation ablation on 12/26/2021. Following her ablation she did have significant chest discomfort. She was started on ibuprofen which resolved her symptoms. ? ?She followed up with Adline Peals 01/28/2022. She continued to have frequent tachypalpitations several days a week, and sometimes multiple times a day. Her Apple watch EKG tracings were reviewed which did show Afib with RVR and SR. Her EKG in clinic showed ectopic atrial rhythm at 97 bpm. After shared decision making she was started on Multaq 400 mg BID. Repeat EKG on 3/28 showed SR, short PR 102, QRS 118, QTc 463. Since starting Multaq she developed some GI side effects which resolved. She denied episodes of Afib at that time. ? ?Overall, she is feeling good. However, she reports her arrhythmic episodes are still present but less severe.  ? ?Following her ablation she confirms that her chest pain at that time lasted for several days. ? ?Just prior to her surgery she started bleeding, and had difficulty getting it to stop. It did stop for a few weeks, but recurred recently and seems persistent. ? ?She denies any chest pain, shortness of breath, or peripheral edema. No lightheadedness, headaches, syncope, orthopnea, or PND. ? ? ?  ? ?Past Medical History:  ?Diagnosis Date  ? Anxiety and depression   ? Atrial fibrillation ([place]) 03/04/2021  ? Cataract   ? COVID-19   ? Hypertension   ? Insomnia   ? Low vitamin D level   ? Sleep apnea   ? Tubular adenoma of colon 2014  ? ? ?Past Surgical History:  ?Procedure  Laterality Date  ? ATRIAL FIBRILLATION ABLATION N/A 12/26/2021  ? Procedure: ATRIAL FIBRILLATION ABLATION;  Surgeon: Vickie Epley, MD;  Location: [facility];  Service: Cardiovascular;  Laterality: N/A;  ? COLONOSCOPY  last 01/01/2016  ? no prior surgery    ? POLYPECTOMY    ? ? ?Current Medications: ?Current Meds  ?Medication Sig  ? apixaban (ELIQUIS) 5 MG TABS tablet Take 1 tablet (5 mg total) by mouth 2 (two) times daily.  ? hydrochlorothiazide (HYDRODIURIL) 25 MG tablet Take 25 mg by mouth daily.  ? MULTAQ 400 MG tablet TAKE 1 TABLET (400 MG TOTAL) BY MOUTH 2 (TWO) TIMES DAILY WITH A MEAL.  ? Norethindrone Acetate-Ethinyl Estrad-FE (LOESTRIN 24 FE) 1-20 MG-MCG(24) tablet Take 1 tablet by mouth daily.  ? pantoprazole (PROTONIX) 40 MG tablet Take 1 tablet (40 mg total) by mouth daily.  ?  ? ?Allergies:   Patient has no known allergies.  ? ?Social History  ? ?Socioeconomic History  ? Marital status: Married  ?  Spouse name: Not on file  ? Number of children: Not on file  ? Years of education: Not on file  ? Highest education level: Not on file  ?Occupational History  ? Not on file  ?Tobacco Use  ? Smoking status: Never  ? Smokeless tobacco: Never  ? Tobacco comments:  ?  Never smoke 01/28/2022  ?Vaping Use  ? Vaping Use: Never used  ?Substance and Sexual Activity  ?  Alcohol use: Yes  ?  Alcohol/week: 6.0 - 12.0 standard drinks  ?  Types: 6 - 12 Glasses of wine per week  ?  Comment: 2-3 glasses a sitting drinks 3-4 days out of week 09/17/2021  ? Drug use: No  ? Sexual activity: Yes  ?  Partners: Male  ?  Birth control/protection: Pill  ?  Comment: 1st intercourse- 20, partners- refursed,  married- 71 yrs   ?Other Topics Concern  ? Not on file  ?Social History Narrative  ? Not on file  ? ?Social Determinants of Health  ? ?Financial Resource Strain: Not on file  ?Food Insecurity: Not on file  ?Transportation Needs: Not on file  ?Physical Activity: Not on file  ?Stress: Not on file  ?Social Connections: Not  on file  ?  ? ?Family History: ?The patient's family history includes Colon cancer (age of onset: 84) in her paternal grandfather; Colon cancer (age of onset: 66) in her paternal grandmother; Colon cancer (age of onset: 65) in her father; Colon polyps (age of onset: 11) in her brother; Hypertension in her father and mother. There is no history of Stomach cancer, Esophageal cancer, or Rectal cancer. ? ?ROS:   ?Please see the history of present illness.    ?(+) Easy bleeding ?All other systems reviewed and are negative. ? ?EKGs/Labs/Other Studies Reviewed:   ? ?The following studies were reviewed today: ? ?12/26/2021   Atrial Fibrillation Ablation: ?CONCLUSIONS: ?1. Successful PVI ?2. Successful ablation of the cavotricuspid isthmus for typical appearing atrial flutter ?3. Intracardiac echo reveals trivial pericardial effusion, normal LA architecture ?4. No early apparent complications. ? ?12/19/2021   Cardiac CTA: ?FINDINGS: ?A 120 kV prospective scan was triggered in the descending thoracic ?aorta at 111 HU's. Gantry rotation speed was 280 msecs and ?collimation was .9 mm. No beta blockade and no NTG was given. The 3D ?data set was reconstructed in 5% intervals of the 0-90% of the R-R ?cycle. Diastolic phases were analyzed on a dedicated work station ?using MPR, MIP and VRT modes. The patient received 80 cc of ?contrast. ?  ?There is normal pulmonary vein drainage into the left atrium (2 on ?the right and 2 on the left) with ostial measurements as follows: ?  ?RSPV: 24.6 x 16.5 mm, area 2.50 cm2 ?  ?RIPV: 15.3 x 14.3 mm, area 1.67 cm2 ?  ?LSPV: 22.0 x 14.7, area 2.50 cm2 ?  ?LIPV: 18.5 x 13.7 mm, area 1.91 cm2 ?  ?The left atrial appendage is large chicken wing / broccoli type with ?a single lobe and ostial size 23 x 18 mm and length 50 mm. There is ?no thrombus in the left atrial appendage. ?  ?The esophagus runs in the left atrial midline and is not in the ?proximity to any of the pulmonary veins. ?  ?Aorta:  Normal  caliber.  No dissection or calcifications. ?  ?Aortic Valve:  Trileaflet.  No calcifications. ?  ?Coronary Arteries: Normal coronary origin. Right dominance. The ?study was performed without use of NTG and insufficient for plaque ?evaluation. ?  ?Calcium score: Coronary calcium score of 0. This was 0 percentile ?for age-, race-, and sex-matched controls. ?  ?IMPRESSION: ?1. There is normal pulmonary vein drainage into the left atrium. ?  ?2. The left atrial appendage is large chicken wing / broccoli type ?with a single lobe and ostial size 23 x 18 mm and length 50 mm. ?There is no thrombus in the left atrial appendage. ?  ?3. The esophagus  runs in the left atrial midline and is not in the ?proximity to any of the pulmonary veins. ?  ?4. Calcium score: Coronary calcium score of 0. This was 0 percentile ?for age-, race-, and sex-matched controls. ? ?04/01/2021 Echo ?LV normal ?RV normal ?No MR ? ? ?11/07/2021 ECG shows  sinus rhythm.  No definite preexcitation.  The PR interval is 130 ms. ?09/30/2021 ECG shows  ectopic atrial rhyth vs junctional rhythm ?09/17/2021 ECG shows sinus rhythm with short PR. No obvious preexcitation ?  ?EKG:  EKG is personally reviewed.  ?03/25/2022: Sinus rhythm with a short PR. ? ? ?Recent Labs: ?12/11/2021: Hemoglobin 13.9; Platelets 265 ?12/26/2021: BUN 10; Creatinine, Ser 1.01; Potassium 4.2; Sodium 140  ? ?Recent Lipid Panel ?No results found for: CHOL, TRIG, HDL, CHOLHDL, VLDL, LDLCALC, LDLDIRECT ? ?Physical Exam:   ? ?VS:  BP 116/76   Pulse 92   Ht '5\' 8"'$  (1.727 m)   Wt 196 lb 6.4 oz (89.1 kg)   SpO2 96%   BMI 29.86 kg/m?    ? ?Wt Readings from Last 3 Encounters:  ?03/25/22 196 lb 6.4 oz (89.1 kg)  ?01/28/22 200 lb (90.7 kg)  ?12/26/21 197 lb (89.4 kg)  ?  ? ?GEN: Well nourished, well developed in no acute distress ?HEENT: Normal ?NECK: No JVD; No carotid bruits ?LYMPHATICS: No lymphadenopathy ?CARDIAC: RRR, no murmurs, rubs, gallops ?RESPIRATORY:  Clear to auscultation without rales,  wheezing or rhonchi  ?ABDOMEN: Soft, non-tender, non-distended ?MUSCULOSKELETAL:  No edema; No deformity  ?SKIN: Warm and dry ?NEUROLOGIC:  Alert and oriented x 3 ?PSYCHIATRIC:  Normal affect  ? ? ? ?  ? ?ASSES

## 2022-03-28 ENCOUNTER — Encounter: Payer: Self-pay | Admitting: Obstetrics & Gynecology

## 2022-03-28 ENCOUNTER — Ambulatory Visit: Payer: 59 | Admitting: Obstetrics & Gynecology

## 2022-03-28 VITALS — BP 120/84

## 2022-03-28 DIAGNOSIS — D219 Benign neoplasm of connective and other soft tissue, unspecified: Secondary | ICD-10-CM | POA: Diagnosis not present

## 2022-03-28 DIAGNOSIS — N951 Menopausal and female climacteric states: Secondary | ICD-10-CM | POA: Diagnosis not present

## 2022-03-28 DIAGNOSIS — Z3041 Encounter for surveillance of contraceptive pills: Secondary | ICD-10-CM

## 2022-03-28 DIAGNOSIS — N921 Excessive and frequent menstruation with irregular cycle: Secondary | ICD-10-CM

## 2022-03-28 DIAGNOSIS — I480 Paroxysmal atrial fibrillation: Secondary | ICD-10-CM

## 2022-03-28 LAB — CBC
HCT: 40.4 % (ref 35.0–45.0)
Hemoglobin: 13.9 g/dL (ref 11.7–15.5)
MCH: 35.7 pg — ABNORMAL HIGH (ref 27.0–33.0)
MCHC: 34.4 g/dL (ref 32.0–36.0)
MCV: 103.9 fL — ABNORMAL HIGH (ref 80.0–100.0)
MPV: 11 fL (ref 7.5–12.5)
Platelets: 282 10*3/uL (ref 140–400)
RBC: 3.89 10*6/uL (ref 3.80–5.10)
RDW: 12.7 % (ref 11.0–15.0)
WBC: 11.2 10*3/uL — ABNORMAL HIGH (ref 3.8–10.8)

## 2022-03-28 MED ORDER — NORETHINDRONE 0.35 MG PO TABS: 1.0000 | ORAL_TABLET | Freq: Every day | ORAL | 4 refills | Status: DC

## 2022-03-28 NOTE — Progress Notes (Signed)
      Katie Williams [facility] 12/25/1965 [id number]        56 y.o.  G0  Married  RP: Menometrorrhagia x 3 months  HPI: On continuous BCPs with LoEstrin 24 Fe 1/20.  Atrial Fibrillation for which patient was started on Eliquis in 11/2021.  Had a Cardiac Ablation procedure in 12/2021.  Started having vaginal spotting most days at that time.  Very heavy vaginal bleeding x last week with blood clots and tissue.  Now improving.  No current pelvic pain.  Feeling warm/hot most of the time.  No FSH done.   OB History  Gravida Para Term Preterm AB Living  0 0 0 0 0 0  SAB IAB Ectopic Multiple Live Births  0 0 0 0 0    Past medical history,surgical history, problem list, medications, allergies, family history and social history were all reviewed and documented in the EPIC chart.   Directed ROS with pertinent positives and negatives documented in the history of present illness/assessment and plan.  Exam:  Vitals:   03/28/22 1131  BP: 120/84   General appearance:  Normal  Abdomen: Normal  Gynecologic exam: Vulva normal.  Bimanual exam:  RV fibroid uterus about 9 cm, mobile, NT.  No adnexal mass, NT.   Assessment/Plan:  56 y.o. G0   1. Menometrorrhagia On continuous BCPs with LoEstrin 24 Fe 1/20.  Atrial Fibrillation for which patient was started on Eliquis in 11/2021.  Had a Cardiac Ablation procedure in 12/2021.  Started having vaginal spotting most days at that time.  Very heavy vaginal bleeding x last week with blood clots and tissue.  Now improving.  No current pelvic pain.  Feeling warm/hot most of the time.  No FSH done. On gyn exam today: RV fibroid uterus about 9 cm, mobile, NT.  No adnexal mass, NT.  Decision to start on the Progestin pill to control the heavy/irregular menses.  No CI to a Progestin pill.  Prescription sent to pharmacy.  CBC today to r/o anemia.  F/U Pelvic US to further investigate. - CBC - US Transvaginal Non-OB; Future  2. Fibroid - US Transvaginal Non-OB;  Future  3. Perimenopause  - [place]; Future  4. Encounter for surveillance of contraceptive pills   Other orders - norethindrone (MICRONOR) 0.35 MG tablet; Take 1 tablet (0.35 mg total) by mouth daily.   Katie Bruins MD, 12:01 PM 03/28/2022

## 2022-04-01 ENCOUNTER — Encounter: Payer: Self-pay | Admitting: Obstetrics & Gynecology

## 2022-04-03 ENCOUNTER — Ambulatory Visit: Payer: 59 | Admitting: Obstetrics & Gynecology

## 2022-04-03 ENCOUNTER — Encounter: Payer: Self-pay | Admitting: Obstetrics & Gynecology

## 2022-04-03 VITALS — BP 160/110 | HR 96

## 2022-04-03 DIAGNOSIS — N921 Excessive and frequent menstruation with irregular cycle: Secondary | ICD-10-CM

## 2022-04-03 DIAGNOSIS — D219 Benign neoplasm of connective and other soft tissue, unspecified: Secondary | ICD-10-CM | POA: Diagnosis not present

## 2022-04-03 DIAGNOSIS — N951 Menopausal and female climacteric states: Secondary | ICD-10-CM

## 2022-04-03 DIAGNOSIS — I480 Paroxysmal atrial fibrillation: Secondary | ICD-10-CM

## 2022-04-03 LAB — CBC
HCT: 38.9 % (ref 35.0–45.0)
Hemoglobin: 13.2 g/dL (ref 11.7–15.5)
MCH: 35.8 pg — ABNORMAL HIGH (ref 27.0–33.0)
MCHC: 33.9 g/dL (ref 32.0–36.0)
MCV: 105.4 fL — ABNORMAL HIGH (ref 80.0–100.0)
MPV: 11 fL (ref 7.5–12.5)
Platelets: 287 10*3/uL (ref 140–400)
RBC: 3.69 10*6/uL — ABNORMAL LOW (ref 3.80–5.10)
RDW: 12.6 % (ref 11.0–15.0)
WBC: 7.2 10*3/uL (ref 3.8–10.8)

## 2022-04-03 MED ORDER — MEGESTROL ACETATE 40 MG PO TABS: 40.0000 mg | ORAL_TABLET | Freq: Two times a day (BID) | ORAL | 2 refills | Status: DC

## 2022-04-03 NOTE — Progress Notes (Signed)
    Katie Williams [facility] Mar 20, 1966 [id number]        56 y.o.  G0   RP: Heavy vaginal bleeding with clots x 2 days  HPI: Heavy vaginal bleeding with clots x 2 days in spite of the Progestin only pill started last visit on 03/28/2022.   Patient has Atrial Fibrillation for which patient was started on Eliquis in 11/2021.  Had a Cardiac Ablation procedure in 12/2021.  Started having vaginal spotting most days at that time.  Very heavy vaginal bleeding x mid May with blood clots and tissue.  Now improving.  No current pelvic pain. Feeling warm/hot most of the time.  No abnormal vaginal discharge.  No fever.  Urine/BMs normal.   OB History  Gravida Para Term Preterm AB Living  0 0 0 0 0 0  SAB IAB Ectopic Multiple Live Births  0 0 0 0 0    Past medical history,surgical history, problem list, medications, allergies, family history and social history were all reviewed and documented in the EPIC chart.   Directed ROS with pertinent positives and negatives documented in the history of present illness/assessment and plan.  Exam:  Vitals:   04/03/22 1148  BP: (!) 160/110  Pulse: 96   General appearance:  Normal  Abdomen: Normal  Gynecologic exam: Vulva normal.  About 3 cm blood clot on the bed.  Bimanual exam:  Uterus RV with a firm Fibroid posteriorly, overall uterine size about 9 cm, mobile, NT.  No adnexal mass felt, NT.  No additional blood clot in the vagina.  Moderate dark menses.   Assessment/Plan:  56 y.o. G0P0000   1. Menometrorrhagia Menometrorrhagia with heavy flow x 2 days, moderate at the time of the pelvic exam, not controled on the Progestin pill.  Will change therapy to Megestrol 40 mg PO BID. Usage reviewed and prescription sent to pharmacy.  R/O Anemia with a repeat CBC today. On 5/19th Hb was 13.9.  Pelvic US advanced to 04/10/22. - CBC  2. Fibroid Menometrorrhagia with Uterine Fibroids on Gyn exam.  Pelvic US advanced to 04/10/22.  3. Paroxysmal atrial fibrillation  [facility]) Patient will verify management plans about Eliquis with Cardio   4. Perimenopause - [facility]  Other orders - megestrol (MEGACE) 40 MG tablet; Take 1 tablet (40 mg total) by mouth 2 (two) times daily.   Counseling on Menometrorrhagia, Fibroids and risks of anemia in the context of Eliquis therapy for 25 minutes.  [name] MD, 12:21 PM 04/03/2022

## 2022-04-04 LAB — FOLLICLE STIMULATING HORMONE: FSH: 2.5 m[IU]/mL

## 2022-04-05 ENCOUNTER — Encounter: Payer: Self-pay | Admitting: Obstetrics & Gynecology

## 2022-04-10 ENCOUNTER — Ambulatory Visit (INDEPENDENT_AMBULATORY_CARE_PROVIDER_SITE_OTHER): Payer: 59 | Admitting: Obstetrics & Gynecology

## 2022-04-10 ENCOUNTER — Ambulatory Visit (INDEPENDENT_AMBULATORY_CARE_PROVIDER_SITE_OTHER): Payer: 59

## 2022-04-10 ENCOUNTER — Encounter: Payer: Self-pay | Admitting: Obstetrics & Gynecology

## 2022-04-10 ENCOUNTER — Other Ambulatory Visit: Payer: Self-pay | Admitting: Obstetrics & Gynecology

## 2022-04-10 ENCOUNTER — Telehealth: Payer: Self-pay | Admitting: *Deleted

## 2022-04-10 VITALS — BP 110/76

## 2022-04-10 DIAGNOSIS — N921 Excessive and frequent menstruation with irregular cycle: Secondary | ICD-10-CM

## 2022-04-10 DIAGNOSIS — N852 Hypertrophy of uterus: Secondary | ICD-10-CM

## 2022-04-10 DIAGNOSIS — D219 Benign neoplasm of connective and other soft tissue, unspecified: Secondary | ICD-10-CM

## 2022-04-10 DIAGNOSIS — N840 Polyp of corpus uteri: Secondary | ICD-10-CM

## 2022-04-10 NOTE — Progress Notes (Signed)
    Katie Williams - [facility] 1966-05-03 [id number]        56 y.o.  G0  RP: Menometrorrhagia/Fibroid for Pelvic US  HPI: Menometrorrhagia with heavy vaginal bleeding controled on Megace 40 mg PO BID currently.  No pelvic pain.   OB History  Gravida Para Term Preterm AB Living  0 0 0 0 0 0  SAB IAB Ectopic Multiple Live Births  0 0 0 0 0    Past medical history,surgical history, problem list, medications, allergies, family history and social history were all reviewed and documented in the EPIC chart.   Directed ROS with pertinent positives and negatives documented in the history of present illness/assessment and plan.  Exam:  There were no vitals filed for this visit. General appearance:  Normal  Pelvic US today: T/a and T/V images.  Enlarged uterus measured at 10.97 x 10.6 x 9.46 cm.  A large posterior fibroid is measured at 8.3 x 8.0 cm and a smaller fibroid at the fundus is measured at 1.8 cm.  Thickened endometrial cavity measured at 10 mm with fluid and debris's.  No feeder vessel can be identified to the cavity.  Both ovaries are small with atrophic appearance.  No adnexal mass.  No free fluid in the pelvis.   Assessment/Plan:  56 y.o. G0  1. Menometrorrhagia Menometrorrhagia with heavy vaginal bleeding controled on Megace 40 mg PO BID currently.  No anemia with a hemoglobin at 13.2 on Apr 03, 2022.  No pelvic pain.  [place], Myosure excision of Polyp and Novasure Endometrial Ablation.  Preop preparation, surgery and risks as well as postop precautions and expectations thoroughly reviewed with patient.  Katie Williams given.  2. Fibroid Large posterior fibroid measured at 8.3 cm mostly subserosal.  Given no anemia with a hemoglobin at 13.2 and patient at age 45, decision to observe.  3. Endometrial polyp  Probable endometrial polyp, we will proceed with MyoSure excision.                        Patient was counseled as to the risk of surgery to include the following:  1. Infection  (prohylactic antibiotics will be administered)  2. DVT/Pulmonary Embolism (prophylactic pneumo compression stockings will be used)  3.Trauma to internal organs requiring additional surgical procedure to repair any injury to internal organs requiring perhaps additional hospitalization days.  4.Hemmorhage requiring transfusion and blood products which carry risks such as anaphylactic reaction, hepatitis and AIDS  Patient had received literature information on the procedure scheduled and all her questions were answered and fully accepts all risk.    Counseling on menometrorrhagia, endometrial polyp, thickened endometrium and uterine fibroids for 15 minutes.  Katie Bruins MD, 12:37 PM 04/10/2022

## 2022-04-10 NOTE — Telephone Encounter (Signed)
-----   Message from [name], MD sent at 04/10/2022  3:10 PM EDT ----- Regarding: Schedule Surgery Surgery: CPT 39672 - Hysteroscopy/D&C/Myosure, CPT [id number] - Novasure Endometrial Ablation  Diagnosis: N92.1 Menometrorrhagia, N84.0 Endometrial Polyp  Location: [place]  Status: Outpatient  Time: 81 Minutes  Assistant: N/A  Urgency: First Available  Pre-Op Appointment: To Be Scheduled  Post-Op Appointment(s): 2 Weeks, 2 Weeks  Time Out Of Work: Day Of Surgery ONLY

## 2022-04-11 NOTE — Telephone Encounter (Signed)
Spoke with patient. Reviewed surgery dates. Patient request to proceed with surgery on 05/28/22.  Advised patient I will forward to business office for return call. I will return call once surgery date and time confirmed. Patient verbalizes understanding and is agreeable.   Surgery request sent.     Dr. Dellis Filbert -patient is scheduled for atrial fibrillation ablation on 06/24/22. Hx of HTN and Atrial fib. Will she need cardiac clearance?

## 2022-04-14 ENCOUNTER — Telehealth: Payer: Self-pay | Admitting: Cardiology

## 2022-04-14 NOTE — Telephone Encounter (Signed)
Clinical pharmacist to review Eliquis 

## 2022-04-14 NOTE — Telephone Encounter (Signed)
   Pre-operative Risk Assessment    Patient Name: Katie Williams  DOB: 1966-09-03 MRN: [id number]      Request for Surgical Clearance    Procedure:   hysteroscopy dnc myosure novasure endometrial ablation    Date of Surgery:  Clearance 05/28/22                                 Surgeon:  Dr. Coralee Pesa Group or Practice Name:  [facility]  Phone number:  [phone] Fax number:  [phone]   Type of Clearance Requested:   - Medical  - Pharmacy:  Hold Apixaban (Eliquis) TBD   Type of Anesthesia:  General    Additional requests/questions:      SignedMilbert Coulter   04/14/2022, 2:37 PM

## 2022-04-14 NOTE — Telephone Encounter (Signed)
Katie Bruins, MD  You 11 minutes ago (2:02 PM)   Yes Cardiac Clearance and might be better to schedule our surgery after the Ablation procedure by Cardio.  That would be my recommendation.    Spoke with patient. Advised per Dr. Dellis Filbert.  Patient request cardiology recommendations prior to cancelling surgery. Advised I will contact cardiology and f/u once clearance received and reviewed with Dr. Dellis Filbert. Patient agreeable.     Call placed to cardiology/ Dr. Quentin Ore, [phone]. Surgical and pharmacy clearance requested. Patient is on Eliquis.

## 2022-04-15 NOTE — Telephone Encounter (Signed)
Patient with diagnosis of Afib on Eliquis for anticoagulation.    Procedure: hysteroscopy dnc myosure novasure endometrial ablation   Date of procedure: 05/28/2022   CHA2DS2-VASc Score = 2  This indicates a 2.2% annual risk of stroke. The patient's score is based upon: CHF History: 0 HTN History: 1 Diabetes History: 0 Stroke History: 0 Vascular Disease History: 0 Age Score: 0 Gender Score: 1      CrCl 90 ml/min Platelet count 287K  Patient underwent afib ablation on 12/26/2021 with plans noted for repeat ablation on 06/24/22. Patient received anticoagulation with Eliquis for 90 days post ablation. EP requests 4 weeks of uninterrupted anticoagulation prior to her second ablation procedure in most recent office visit note, which would require Eliquis to be resumed by 05/27/22. Procedure will need to be moved up several days in order to resume anticoagulation by 05/27/2022. May hold Eliquis 1-2 day prior to surgery once procedure date is moved up.

## 2022-04-15 NOTE — Telephone Encounter (Signed)
    Patient Name: Katie Williams  DOB: 1965/12/27 MRN: [id number]  Primary Cardiologist: None  Chart reviewed as part of pre-operative protocol coverage. Dr. Quentin Ore you saw the patient on 03/25/22. Per note "continued to have symptomatic salvos of atrial fibrillation". Plan for repeat ablation.  She was having vaginal bleeding and now requiring hysteroscopy dnc myosure novasure endometrial ablation.Any special instruction beside pharmacist recommendation ? Please forward your response to P CV DIV PREOP.   Thank you

## 2022-04-20 ENCOUNTER — Encounter: Payer: Self-pay | Admitting: Obstetrics & Gynecology

## 2022-04-21 NOTE — Telephone Encounter (Signed)
Patient left message requesting return call.  Spoke with patient, patient is requesting surgery be moved to earlier date per cardiology recommendations. Needs 4 weeks of uninterrupted anticoagulant prior to atrial fibrillation ablation scheduled on 06/24/22. Patient states she would need to wait 90 days after ablation if she needs to reschedule GYN surgery to later date. Reviewed cardiology notes in Epic, "OK to hold [facility] 2-3 days prior to the scheduled surgery. Needs 4 weeks uninterrupted AC prior to ablation. Agree with pharmacy recommendations".   Advised patient I will review with Dr. Dellis Filbert and then review GYN surgery schedule and return call. Patient appreciative.     Reviewed with Dr. Dellis Filbert, ok to proceed with surgery on 05/06/22. Hold Eliquis 3 days prior to surgery.    Call placed to Central Scheduling, spoke with Maudie Mercury. Surgery moved to 05/06/22 at 1050.

## 2022-04-21 NOTE — Telephone Encounter (Signed)
OK to hold [facility] 2-3 days prior to the scheduled surgery. Needs 4 weeks uninterrupted AC prior to ablation. Agree with pharmacy recommendations.    Thanks,  Lysbeth Galas T. Quentin Ore, MD, [facility], [facility]  Cardiac Electrophysiology   Called patient and advised her that the timing of the procedures does not currently allow her to be on anticoagulation without interruption for 4 weeks prior to ablation. She is going to call her gynecology office to discuss rescheduling.

## 2022-04-21 NOTE — Telephone Encounter (Signed)
Spoke with patient. Surgery date request confirmed.  Advised surgery is scheduled for 05/06/22, [facility] at 1050. Surgery instruction sheet and hospital brochure reviewed, printed copy will be mailed.  Eliquis instructions reviewed, stop 3 days prior to surgery.  Patient will return call to cardiology to provide update.  Patient verbalizes understanding and is agreeable.     Dr. Dellis Filbert -Is additional pre-op visit needed? OV completed 04/10/22.    Cc: [employer]

## 2022-04-24 NOTE — Telephone Encounter (Signed)
Spoke with patient regarding surgery benefits. Patient acknowledges understanding of information presented. Patient is aware that benefits presented are professional benefits only. Patient is aware the hospital will call with facility benefits. See account note.  Routing to Glorianne Manchester, RN.: Patient has questions pertaining to medication

## 2022-04-24 NOTE — Telephone Encounter (Signed)
Dr. Dellis Filbert -Is additional pre-op visit needed? OV completed 04/10/22.

## 2022-04-30 ENCOUNTER — Encounter (HOSPITAL_BASED_OUTPATIENT_CLINIC_OR_DEPARTMENT_OTHER): Payer: Self-pay | Admitting: Obstetrics & Gynecology

## 2022-04-30 NOTE — Telephone Encounter (Signed)
[name], MD  You 1 hour ago (8:19 AM)    Yes schedule a preop visit, can be a video visit if she prefers.       Spoke with patient. Pre-op  scheduled for 05/02/22 at 1130.   Patient also requesting to confirm when last dose of Eliquis to be taken. Advised per previous recommendations, stop 3 days prior to surgery. Take last dose on Friday 05/02/22.   Patient verbalizes understanding and [place] agreeable.   Routing to provider for final review. Patient is agreeable to disposition. Will close encounter.

## 2022-05-01 ENCOUNTER — Encounter (HOSPITAL_BASED_OUTPATIENT_CLINIC_OR_DEPARTMENT_OTHER): Payer: Self-pay | Admitting: Obstetrics & Gynecology

## 2022-05-01 NOTE — Progress Notes (Signed)
Spoke w/ via phone for pre-op interview--- pt Lab needs dos----  [facility], urine preg (per anes)/  pre-op orders pending             Lab results------ current ekg in epic/ chart COVID test -----patient states asymptomatic no test needed Arrive at ------- 0845 on 05-06-2022 NPO after MN NO Solid Food.  Clear liquids from MN until--- 0745 Med rec completed Medications to take morning of surgery ----- multaq, megace, protonix Diabetic medication ----- no Patient instructed no nail polish to be worn day of surgery Patient instructed to bring photo id and insurance card day of surgery Patient aware to have Driver (ride ) / caregiver   for 24 hours after surgery -- mother, paula Patient Special Instructions ----- n/a Pre-Op special Istructions ----- pt has telephone cardiac clearance by Dr Grayce Sessions on 04-20-2022 in epic/ chart Patient verbalized understanding of instructions that were given at this phone interview. Patient denies shortness of breath, chest pain, fever, cough at this phone interview.    Anesthesia Review:  s/p EP AFib ablation 12-26-2021 residual PAF/ Tachycardia scheduled for second ablation 06-2022.  HTN;  OSA uses cpap nightly Pt denies cardiac s&s, sob , and no peripheral  PCP: Dr Dillon Bjork Cardiologist :Dr Grayce Sessions (lov 04-14-2022) Chest x-ray : no EKG : 04-14-2022 Echo : 04-01-2022 Stress test: no Cardiac Cath : no Activity level: denies sob w/ any activity Sleep Study/ CPAP : Yes/ Yes  Blood Thinner/ Instructions /Last Dose: Eliquis ASA / Instructions/ Last Dose :  No Per pt was given instructions by cardiology to stop eliquis 3 days prior to surgery , stated last dose Friday evening of 05-02-2022

## 2022-05-02 ENCOUNTER — Encounter: Payer: Self-pay | Admitting: Obstetrics & Gynecology

## 2022-05-02 ENCOUNTER — Ambulatory Visit (INDEPENDENT_AMBULATORY_CARE_PROVIDER_SITE_OTHER): Payer: 59 | Admitting: Obstetrics & Gynecology

## 2022-05-02 VITALS — BP 128/90 | HR 84

## 2022-05-02 DIAGNOSIS — N921 Excessive and frequent menstruation with irregular cycle: Secondary | ICD-10-CM | POA: Diagnosis not present

## 2022-05-02 DIAGNOSIS — D219 Benign neoplasm of connective and other soft tissue, unspecified: Secondary | ICD-10-CM

## 2022-05-02 DIAGNOSIS — N840 Polyp of corpus uteri: Secondary | ICD-10-CM

## 2022-05-06 ENCOUNTER — Encounter (HOSPITAL_BASED_OUTPATIENT_CLINIC_OR_DEPARTMENT_OTHER): Payer: Self-pay | Admitting: Obstetrics & Gynecology

## 2022-05-06 ENCOUNTER — Ambulatory Visit (HOSPITAL_BASED_OUTPATIENT_CLINIC_OR_DEPARTMENT_OTHER): Payer: 59 | Admitting: Anesthesiology

## 2022-05-06 ENCOUNTER — Other Ambulatory Visit: Payer: Self-pay

## 2022-05-06 ENCOUNTER — Encounter (HOSPITAL_BASED_OUTPATIENT_CLINIC_OR_DEPARTMENT_OTHER)
Admission: RE | Disposition: A | Discharge status: Home / Self Care | Payer: Self-pay | Source: Home / Self Care | Attending: Obstetrics & Gynecology

## 2022-05-06 ENCOUNTER — Ambulatory Visit (HOSPITAL_BASED_OUTPATIENT_CLINIC_OR_DEPARTMENT_OTHER)
Admission: RE | Admit: 2022-05-06 | Discharge: 2022-05-06 | Disposition: A | Discharge status: Home / Self Care | Payer: 59 | Attending: Obstetrics & Gynecology | Admitting: Obstetrics & Gynecology

## 2022-05-06 DIAGNOSIS — K219 Gastro-esophageal reflux disease without esophagitis: Secondary | ICD-10-CM | POA: Insufficient documentation

## 2022-05-06 DIAGNOSIS — D251 Intramural leiomyoma of uterus: Secondary | ICD-10-CM | POA: Diagnosis not present

## 2022-05-06 DIAGNOSIS — I480 Paroxysmal atrial fibrillation: Secondary | ICD-10-CM | POA: Insufficient documentation

## 2022-05-06 DIAGNOSIS — Z7901 Long term (current) use of anticoagulants: Secondary | ICD-10-CM | POA: Diagnosis not present

## 2022-05-06 DIAGNOSIS — N921 Excessive and frequent menstruation with irregular cycle: Secondary | ICD-10-CM | POA: Insufficient documentation

## 2022-05-06 DIAGNOSIS — I10 Essential (primary) hypertension: Secondary | ICD-10-CM | POA: Insufficient documentation

## 2022-05-06 DIAGNOSIS — G4733 Obstructive sleep apnea (adult) (pediatric): Secondary | ICD-10-CM | POA: Insufficient documentation

## 2022-05-06 DIAGNOSIS — Z79899 Other long term (current) drug therapy: Secondary | ICD-10-CM | POA: Insufficient documentation

## 2022-05-06 DIAGNOSIS — Z01818 Encounter for other preprocedural examination: Secondary | ICD-10-CM

## 2022-05-06 DIAGNOSIS — N840 Polyp of corpus uteri: Secondary | ICD-10-CM | POA: Insufficient documentation

## 2022-05-06 HISTORY — DX: Tachycardia, unspecified: R00.0

## 2022-05-06 HISTORY — DX: Solitary pulmonary nodule: R91.1

## 2022-05-06 HISTORY — DX: Long term (current) use of anticoagulants: Z79.01

## 2022-05-06 HISTORY — DX: Personal history of colonic polyps: Z86.010

## 2022-05-06 HISTORY — DX: Gastro-esophageal reflux disease without esophagitis: K21.9

## 2022-05-06 HISTORY — PX: DILATATION & CURETTAGE/HYSTEROSCOPY WITH MYOSURE: SHX6511

## 2022-05-06 HISTORY — DX: Obstructive sleep apnea (adult) (pediatric): G47.33

## 2022-05-06 HISTORY — PX: DILITATION & CURRETTAGE/HYSTROSCOPY WITH NOVASURE ABLATION: SHX5568

## 2022-05-06 HISTORY — DX: Personal history of adenomatous and serrated colon polyps: Z86.0101

## 2022-05-06 LAB — POCT I-STAT, CHEM 8
BUN: 12 mg/dL (ref 6–20)
Calcium, Ion: 1.14 mmol/L — ABNORMAL LOW (ref 1.15–1.40)
Chloride: 107 mmol/L (ref 98–111)
Creatinine, Ser: 1.1 mg/dL — ABNORMAL HIGH (ref 0.44–1.00)
Glucose, Bld: 85 mg/dL (ref 70–99)
HCT: 43 % (ref 36.0–46.0)
Hemoglobin: 14.6 g/dL (ref 12.0–15.0)
Potassium: 3.2 mmol/L — ABNORMAL LOW (ref 3.5–5.1)
Sodium: 142 mmol/L (ref 135–145)
TCO2: 19 mmol/L — ABNORMAL LOW (ref 22–32)

## 2022-05-06 LAB — CBC
HCT: 39.7 % (ref 36.0–46.0)
Hemoglobin: 13.9 g/dL (ref 12.0–15.0)
MCH: 36.5 pg — ABNORMAL HIGH (ref 26.0–34.0)
MCHC: 35 g/dL (ref 30.0–36.0)
MCV: 104.2 fL — ABNORMAL HIGH (ref 80.0–100.0)
Platelets: 281 10*3/uL (ref 150–400)
RBC: 3.81 MIL/uL — ABNORMAL LOW (ref 3.87–5.11)
RDW: 13.8 % (ref 11.5–15.5)
WBC: 6.7 10*3/uL (ref 4.0–10.5)
nRBC: 0 % (ref 0.0–0.2)

## 2022-05-06 LAB — POCT PREGNANCY, URINE: Preg Test, Ur: NEGATIVE

## 2022-05-06 SURGERY — DILATATION & CURETTAGE/HYSTEROSCOPY WITH MYOSURE
Anesthesia: General | Site: Vagina

## 2022-05-06 MED ORDER — ONDANSETRON HCL 4 MG/2ML IJ SOLN
INTRAMUSCULAR | Status: DC | PRN
  Administered 2022-05-06: 4 mg via INTRAVENOUS

## 2022-05-06 MED ORDER — FENTANYL CITRATE (PF) 250 MCG/5ML IJ SOLN
INTRAMUSCULAR | Status: DC | PRN
  Administered 2022-05-06: 25 ug via INTRAVENOUS
  Administered 2022-05-06: 50 ug via INTRAVENOUS
  Administered 2022-05-06: 25 ug via INTRAVENOUS

## 2022-05-06 MED ORDER — SILVER NITRATE-POT NITRATE 75-25 % EX MISC
CUTANEOUS | Status: DC | PRN
  Administered 2022-05-06 (×2): 2

## 2022-05-06 MED ORDER — PROPOFOL 10 MG/ML IV BOLUS
INTRAVENOUS | Status: DC | PRN
  Administered 2022-05-06: 170 mg via INTRAVENOUS

## 2022-05-06 MED ORDER — MIDAZOLAM HCL 2 MG/2ML IJ SOLN
INTRAMUSCULAR | Status: AC
  Filled 2022-05-06: qty 2

## 2022-05-06 MED ORDER — FENTANYL CITRATE (PF) 100 MCG/2ML IJ SOLN: 25.0000 ug | INTRAMUSCULAR | Status: DC | PRN

## 2022-05-06 MED ORDER — LIDOCAINE 2% (20 MG/ML) 5 ML SYRINGE
INTRAMUSCULAR | Status: DC | PRN
  Administered 2022-05-06: 100 mg via INTRAVENOUS

## 2022-05-06 MED ORDER — KETOROLAC TROMETHAMINE 30 MG/ML IJ SOLN
INTRAMUSCULAR | Status: DC | PRN
  Administered 2022-05-06: 30 mg via INTRAVENOUS

## 2022-05-06 MED ORDER — ACETAMINOPHEN 500 MG PO TABS
ORAL_TABLET | ORAL | Status: AC
  Filled 2022-05-06: qty 2

## 2022-05-06 MED ORDER — LACTATED RINGERS IV SOLN: INTRAVENOUS | Status: DC

## 2022-05-06 MED ORDER — SODIUM CHLORIDE 0.9 % IR SOLN
Status: DC | PRN
  Administered 2022-05-06: 3000 mL

## 2022-05-06 MED ORDER — MIDAZOLAM HCL 2 MG/2ML IJ SOLN
INTRAMUSCULAR | Status: DC | PRN
  Administered 2022-05-06: 2 mg via INTRAVENOUS

## 2022-05-06 MED ORDER — ACETAMINOPHEN 500 MG PO TABS
1000.0000 mg | ORAL_TABLET | Freq: Once | ORAL | Status: AC
  Administered 2022-05-06: 1000 mg via ORAL

## 2022-05-06 MED ORDER — OXYCODONE HCL 5 MG/5ML PO SOLN: 5.0000 mg | Freq: Once | ORAL | Status: DC | PRN

## 2022-05-06 MED ORDER — SCOPOLAMINE 1 MG/3DAYS TD PT72
MEDICATED_PATCH | TRANSDERMAL | Status: AC
  Filled 2022-05-06: qty 1

## 2022-05-06 MED ORDER — CHLOROPROCAINE HCL 1 % IJ SOLN
INTRAMUSCULAR | Status: DC | PRN
  Administered 2022-05-06: 10 mL

## 2022-05-06 MED ORDER — CEFAZOLIN SODIUM-DEXTROSE 2-4 GM/100ML-% IV SOLN
INTRAVENOUS | Status: AC
  Filled 2022-05-06: qty 100

## 2022-05-06 MED ORDER — ONDANSETRON HCL 4 MG/2ML IJ SOLN
INTRAMUSCULAR | Status: AC
  Filled 2022-05-06: qty 2

## 2022-05-06 MED ORDER — AMISULPRIDE (ANTIEMETIC) 5 MG/2ML IV SOLN
10.0000 mg | Freq: Once | INTRAVENOUS | Status: DC | PRN
Start: 2022-05-06 — End: 2022-05-06

## 2022-05-06 MED ORDER — OXYCODONE HCL 5 MG PO TABS: 5.0000 mg | ORAL_TABLET | Freq: Once | ORAL | Status: DC | PRN

## 2022-05-06 MED ORDER — DEXAMETHASONE SODIUM PHOSPHATE 10 MG/ML IJ SOLN
INTRAMUSCULAR | Status: AC
  Filled 2022-05-06: qty 1

## 2022-05-06 MED ORDER — CEFAZOLIN SODIUM-DEXTROSE 2-4 GM/100ML-% IV SOLN
2.0000 g | INTRAVENOUS | Status: AC
  Administered 2022-05-06: 2 g via INTRAVENOUS

## 2022-05-06 MED ORDER — SCOPOLAMINE 1 MG/3DAYS TD PT72
1.0000 | MEDICATED_PATCH | Freq: Once | TRANSDERMAL | Status: DC
  Administered 2022-05-06: 1.5 mg via TRANSDERMAL

## 2022-05-06 MED ORDER — FENTANYL CITRATE (PF) 100 MCG/2ML IJ SOLN
INTRAMUSCULAR | Status: AC
  Filled 2022-05-06: qty 2

## 2022-05-06 MED ORDER — DEXAMETHASONE SODIUM PHOSPHATE 10 MG/ML IJ SOLN
INTRAMUSCULAR | Status: DC | PRN
  Administered 2022-05-06: 10 mg via INTRAVENOUS

## 2022-05-06 MED ORDER — PROPOFOL 10 MG/ML IV BOLUS
INTRAVENOUS | Status: AC
  Filled 2022-05-06: qty 20

## 2022-05-06 MED ORDER — LIDOCAINE HCL (PF) 2 % IJ SOLN
INTRAMUSCULAR | Status: AC
  Filled 2022-05-06: qty 5

## 2022-05-06 MED ORDER — KETOROLAC TROMETHAMINE 30 MG/ML IJ SOLN
INTRAMUSCULAR | Status: AC
  Filled 2022-05-06: qty 1

## 2022-05-06 MED ORDER — ROCURONIUM BROMIDE 10 MG/ML (PF) SYRINGE
PREFILLED_SYRINGE | INTRAVENOUS | Status: AC
  Filled 2022-05-06: qty 10

## 2022-05-06 SURGICAL SUPPLY — 19 items
ABLATOR SURESOUND NOVASURE (ABLATOR) ×2 IMPLANT
DEVICE MYOSURE REACH (MISCELLANEOUS) ×1 IMPLANT
DRSG TELFA 3X8 NADH (GAUZE/BANDAGES/DRESSINGS) ×2 IMPLANT
GLOVE BIO SURGEON STRL SZ 6.5 (GLOVE) ×2 IMPLANT
GLOVE BIOGEL PI IND STRL 6.5 (GLOVE) IMPLANT
GLOVE BIOGEL PI IND STRL 7.0 (GLOVE) ×2 IMPLANT
GLOVE BIOGEL PI INDICATOR 6.5 (GLOVE) ×1
GLOVE BIOGEL PI INDICATOR 7.0 (GLOVE) ×3
GOWN STRL REUS W/TWL LRG LVL3 (GOWN DISPOSABLE) ×4 IMPLANT
IV NS IRRIG 3000ML ARTHROMATIC (IV SOLUTION) ×2 IMPLANT
KIT PROCEDURE FLUENT (KITS) ×2 IMPLANT
KIT TURNOVER CYSTO (KITS) ×2 IMPLANT
PACK VAGINAL MINOR WOMEN LF (CUSTOM PROCEDURE TRAY) ×2 IMPLANT
PAD DRESSING TELFA 3X8 NADH (GAUZE/BANDAGES/DRESSINGS) ×1 IMPLANT
PAD OB MATERNITY 4.3X12.25 (PERSONAL CARE ITEMS) ×2 IMPLANT
PAD PREP 24X48 CUFFED NSTRL (MISCELLANEOUS) ×2 IMPLANT
SEAL ROD LENS SCOPE MYOSURE (ABLATOR) ×2 IMPLANT
SPIKE FLUID TRANSFER (MISCELLANEOUS) ×1 IMPLANT
TOWEL OR 17X26 10 PK STRL BLUE (TOWEL DISPOSABLE) ×5 IMPLANT

## 2022-05-06 NOTE — Anesthesia Procedure Notes (Signed)
Procedure Name: LMA Insertion Date/Time: 05/06/2022 11:05 AM  Performed by: Dairl Ponder, CRNAPre-anesthesia Checklist: Patient identified, Emergency Drugs available, Suction available and Patient being monitored Patient Re-evaluated:Patient Re-evaluated prior to induction Oxygen Delivery Method: Circle System Utilized Preoxygenation: Pre-oxygenation with 100% oxygen Induction Type: IV induction Ventilation: Mask ventilation without difficulty LMA: LMA inserted LMA Size: 4.0 Number of attempts: 1 Airway Equipment and Method: Bite block Placement Confirmation: positive ETCO2 Tube secured with: Tape Dental Injury: Teeth and Oropharynx as per pre-operative assessment

## 2022-05-06 NOTE — Anesthesia Preprocedure Evaluation (Addendum)
Anesthesia Evaluation  Patient identified by MRN, date of birth, ID band Patient awake    Reviewed: Allergy & Precautions, NPO status , Patient's Chart, lab work & pertinent test results  History of Anesthesia Complications Negative for: history of anesthetic complications  Airway Mallampati: I  TM Distance: >3 FB Neck ROM: Full    Dental no notable dental hx.    Pulmonary sleep apnea and Continuous Positive Airway Pressure Ventilation ,    Pulmonary exam normal        Cardiovascular hypertension, Pt. on medications Normal cardiovascular exam+ dysrhythmias (s/p ablation 2022, on Eliquis) Atrial Fibrillation      Neuro/Psych Anxiety Depression negative neurological ROS     GI/Hepatic Neg liver ROS, GERD  Medicated and Controlled,  Endo/Other  negative endocrine ROS  Renal/GU negative Renal ROS  negative genitourinary   Musculoskeletal negative musculoskeletal ROS (+)   Abdominal   Peds  Hematology negative hematology ROS (+)   Anesthesia Other Findings Day of surgery medications reviewed with patient.  Reproductive/Obstetrics negative OB ROS                            Anesthesia Physical Anesthesia Plan  ASA: 2  Anesthesia Plan: General   Post-op Pain Management: Tylenol PO (pre-op)*   Induction: Intravenous  PONV Risk Score and Plan: 3 and Treatment may vary due to age or medical condition, Ondansetron, Dexamethasone and Midazolam  Airway Management Planned: LMA  Additional Equipment: None  Intra-op Plan:   Post-operative Plan: Extubation in OR  Informed Consent: I have reviewed the patients History and Physical, chart, labs and discussed the procedure including the risks, benefits and alternatives for the proposed anesthesia with the patient or authorized representative who has indicated his/her understanding and acceptance.     Dental advisory given  Plan Discussed  with: CRNA  Anesthesia Plan Comments:        Anesthesia Quick Evaluation

## 2022-05-06 NOTE — Progress Notes (Signed)
Informed Katie Williams- patient's mother - no Tylenol until after 3:00 pm today and no NSAIDS until after 5:00 pm today if needed. Repeated back instructions correctly for understanding.

## 2022-05-07 ENCOUNTER — Encounter (HOSPITAL_BASED_OUTPATIENT_CLINIC_OR_DEPARTMENT_OTHER): Payer: Self-pay | Admitting: Obstetrics & Gynecology

## 2022-05-07 LAB — SURGICAL PATHOLOGY

## 2022-05-16 ENCOUNTER — Ambulatory Visit (INDEPENDENT_AMBULATORY_CARE_PROVIDER_SITE_OTHER): Payer: 59 | Admitting: Obstetrics & Gynecology

## 2022-05-16 ENCOUNTER — Encounter: Payer: Self-pay | Admitting: Obstetrics & Gynecology

## 2022-05-16 VITALS — BP 120/76 | HR 99

## 2022-05-16 DIAGNOSIS — N840 Polyp of corpus uteri: Secondary | ICD-10-CM

## 2022-05-16 DIAGNOSIS — Z09 Encounter for follow-up examination after completed treatment for conditions other than malignant neoplasm: Secondary | ICD-10-CM

## 2022-05-16 DIAGNOSIS — D219 Benign neoplasm of connective and other soft tissue, unspecified: Secondary | ICD-10-CM

## 2022-05-16 DIAGNOSIS — Z3041 Encounter for surveillance of contraceptive pills: Secondary | ICD-10-CM

## 2022-05-16 NOTE — Progress Notes (Signed)
    Katie Williams [facility] 1966/08/28 [id number]        56 y.o.  G0P0000   RP: Post op HSC/Myosure Excision/D+C/Novasure Endometrial Ablation  HPI: Good postop healing.  No abdominopelvic pain.  Mild brownish vaginal secretions.  No fever.  Taking the Progestin-only BCPs.   OB History  Gravida Para Term Preterm AB Living  0 0 0 0 0 0  SAB IAB Ectopic Multiple Live Births  0 0 0 0 0    Past medical history,surgical history, problem list, medications, allergies, family history and social history were all reviewed and documented in the EPIC chart.   Directed ROS with pertinent positives and negatives documented in the history of present illness/assessment and plan.  Exam:  Vitals:   05/16/22 1201  BP: 120/76  Pulse: 99  SpO2: 99%   General appearance:  Normal  Abdomen: Normal  Gynecologic exam: Vulva normal.  Bimanual exam:  Uterus AV/Post Fibroid, mobile, NT.  No adnexal mass, NT.  Mild brownish secretions.  Patho 05/06/22: FINAL MICROSCOPIC DIAGNOSIS:   A. ENDOMETRIUM, POLYP, CURETTAGE:  Benign endometrium with marked progestational effect  Focal changes compatible with benign endometrial polyp  Abundant myometrium suggestive of a submucosal leiomyoma  Negative for breakdown, atypia, hyperplasia and carcinoma               Assessment/Plan:  56 y.o. G0P0000   1. Status post gynecological surgery, follow-up exam Good postop healing, no complication.  No abdominopelvic pain.  Mild brownish vaginal secretions.  No fever.  Taking the Progestin-only BCPs.  Normal gynecologic exam.  Patho benign, patient informed and reassured.  F/U Annual Gyn exam when due.  2. Endometrial polyp Benign endometrial polyp.  3. Fibroid S/P Novasure Endometrial Ablation.  4. Encounter for surveillance of contraceptive pills Well on the Progestin only BCPs.  No CI.  Will continue on the Progestin only BCPs.  Other orders - norethindrone (MICRONOR) 0.35 MG tablet; Take 1 tablet by mouth  daily.   [name] MD, 12:13 PM 05/16/2022

## 2022-05-22 ENCOUNTER — Other Ambulatory Visit: Payer: 59

## 2022-05-22 ENCOUNTER — Other Ambulatory Visit: Payer: 59 | Admitting: Obstetrics & Gynecology

## 2022-06-05 ENCOUNTER — Other Ambulatory Visit: Payer: Self-pay

## 2022-06-05 ENCOUNTER — Emergency Department (HOSPITAL_COMMUNITY)
Admission: EM | Admit: 2022-06-05 | Discharge: 2022-06-06 | Disposition: A | Discharge status: Home / Self Care | Payer: 59 | Attending: Emergency Medicine | Admitting: Emergency Medicine

## 2022-06-05 ENCOUNTER — Emergency Department (HOSPITAL_COMMUNITY): Payer: 59

## 2022-06-05 ENCOUNTER — Encounter (HOSPITAL_COMMUNITY): Payer: Self-pay

## 2022-06-05 DIAGNOSIS — Z8616 Personal history of COVID-19: Secondary | ICD-10-CM | POA: Diagnosis not present

## 2022-06-05 DIAGNOSIS — S01511A Laceration without foreign body of lip, initial encounter: Secondary | ICD-10-CM | POA: Diagnosis not present

## 2022-06-05 DIAGNOSIS — Z79899 Other long term (current) drug therapy: Secondary | ICD-10-CM | POA: Insufficient documentation

## 2022-06-05 DIAGNOSIS — I480 Paroxysmal atrial fibrillation: Secondary | ICD-10-CM

## 2022-06-05 DIAGNOSIS — F10920 Alcohol use, unspecified with intoxication, uncomplicated: Secondary | ICD-10-CM

## 2022-06-05 DIAGNOSIS — W01198A Fall on same level from slipping, tripping and stumbling with subsequent striking against other object, initial encounter: Secondary | ICD-10-CM | POA: Diagnosis not present

## 2022-06-05 DIAGNOSIS — Z23 Encounter for immunization: Secondary | ICD-10-CM | POA: Diagnosis not present

## 2022-06-05 DIAGNOSIS — R55 Syncope and collapse: Secondary | ICD-10-CM

## 2022-06-05 DIAGNOSIS — E876 Hypokalemia: Secondary | ICD-10-CM | POA: Diagnosis not present

## 2022-06-05 DIAGNOSIS — I10 Essential (primary) hypertension: Secondary | ICD-10-CM | POA: Diagnosis not present

## 2022-06-05 DIAGNOSIS — Y990 Civilian activity done for income or pay: Secondary | ICD-10-CM | POA: Insufficient documentation

## 2022-06-05 DIAGNOSIS — Z7901 Long term (current) use of anticoagulants: Secondary | ICD-10-CM | POA: Insufficient documentation

## 2022-06-05 DIAGNOSIS — S0993XA Unspecified injury of face, initial encounter: Secondary | ICD-10-CM | POA: Diagnosis present

## 2022-06-05 LAB — CBC WITH DIFFERENTIAL/PLATELET
Abs Immature Granulocytes: 0.04 10*3/uL (ref 0.00–0.07)
Basophils Absolute: 0.1 10*3/uL (ref 0.0–0.1)
Basophils Relative: 1 %
Eosinophils Absolute: 0.1 10*3/uL (ref 0.0–0.5)
Eosinophils Relative: 1 %
HCT: 38.9 % (ref 36.0–46.0)
Hemoglobin: 13.3 g/dL (ref 12.0–15.0)
Immature Granulocytes: 0 %
Lymphocytes Relative: 28 %
Lymphs Abs: 2.7 10*3/uL (ref 0.7–4.0)
MCH: 35.5 pg — ABNORMAL HIGH (ref 26.0–34.0)
MCHC: 34.2 g/dL (ref 30.0–36.0)
MCV: 103.7 fL — ABNORMAL HIGH (ref 80.0–100.0)
Monocytes Absolute: 0.7 10*3/uL (ref 0.1–1.0)
Monocytes Relative: 7 %
Neutro Abs: 5.9 10*3/uL (ref 1.7–7.7)
Neutrophils Relative %: 63 %
Platelets: 336 10*3/uL (ref 150–400)
RBC: 3.75 MIL/uL — ABNORMAL LOW (ref 3.87–5.11)
RDW: 13.7 % (ref 11.5–15.5)
WBC: 9.6 10*3/uL (ref 4.0–10.5)
nRBC: 0 % (ref 0.0–0.2)

## 2022-06-05 LAB — COMPREHENSIVE METABOLIC PANEL
ALT: 40 U/L (ref 0–44)
AST: 80 U/L — ABNORMAL HIGH (ref 15–41)
Albumin: 4.6 g/dL (ref 3.5–5.0)
Alkaline Phosphatase: 89 U/L (ref 38–126)
Anion gap: 17 — ABNORMAL HIGH (ref 5–15)
BUN: 9 mg/dL (ref 6–20)
CO2: 19 mmol/L — ABNORMAL LOW (ref 22–32)
Calcium: 9.4 mg/dL (ref 8.9–10.3)
Chloride: 106 mmol/L (ref 98–111)
Creatinine, Ser: 1.08 mg/dL — ABNORMAL HIGH (ref 0.44–1.00)
GFR, Estimated: >60 mL/min (ref >60)
Glucose, Bld: 94 mg/dL (ref 70–99)
Potassium: 3 mmol/L — ABNORMAL LOW (ref 3.5–5.1)
Sodium: 142 mmol/L (ref 135–145)
Total Bilirubin: 0.8 mg/dL (ref 0.3–1.2)
Total Protein: 7.8 g/dL (ref 6.5–8.1)

## 2022-06-05 LAB — ETHANOL: Alcohol, Ethyl (B): 384 mg/dL (ref <10)

## 2022-06-05 LAB — TROPONIN I (HIGH SENSITIVITY): Troponin I (High Sensitivity): 7 ng/L (ref <18)

## 2022-06-05 MED ORDER — POTASSIUM CHLORIDE ER 10 MEQ PO TBCR: 10.0000 meq | EXTENDED_RELEASE_TABLET | Freq: Two times a day (BID) | ORAL | 0 refills | Status: DC

## 2022-06-05 MED ORDER — POTASSIUM CHLORIDE 10 MEQ/100ML IV SOLN
10.0000 meq | Freq: Once | INTRAVENOUS | Status: AC
  Administered 2022-06-05: 10 meq via INTRAVENOUS
  Filled 2022-06-05: qty 100

## 2022-06-05 MED ORDER — LIDOCAINE HCL (PF) 1 % IJ SOLN
5.0000 mL | Freq: Once | INTRAMUSCULAR | Status: AC
  Administered 2022-06-05: 5 mL
  Filled 2022-06-05: qty 5

## 2022-06-05 MED ORDER — TETANUS-DIPHTH-ACELL PERTUSSIS 5-2.5-18.5 LF-MCG/0.5 IM SUSY
0.5000 mL | PREFILLED_SYRINGE | Freq: Once | INTRAMUSCULAR | Status: AC
Start: 2022-06-05 — End: 2022-06-05
  Administered 2022-06-05: 0.5 mL via INTRAMUSCULAR
  Filled 2022-06-05: qty 0.5

## 2022-06-05 NOTE — Discharge Instructions (Signed)
The stitches can come out in around 5 days.  Follow-up with your cardiologist for the passing out.

## 2022-06-05 NOTE — ED Provider Notes (Signed)
[facility] EMERGENCY DEPARTMENT Provider Note   CSN: [id number] Arrival date & time: 06/05/22  2024     History  Chief Complaint  Patient presents with   Loss of Consciousness    Katie Williams is a 56 y.o. female.   Loss of Consciousness Patient presented as a level 2 trauma.  Patient was reportedly at work for a movie with family members.  Had a syncopal episode.  Stood up fell forward and was unconscious.  Laceration through and through on lower lip.  No neck pain.  States teeth do not hurt.  Patient does smell of alcohol but states she would not drink because she was with her niece.  No chest or abdominal pain.  History of atrial fibrillation scheduled for second ablation.  States She Will Sometimes Get A-Fib and Her Fast Heart Can GoFast or slow.  Was a level 2 trauma since she is on Eliquis for the A-fib.  For EMS reportedly had some confusion and repetitive questions.    Past Medical History:  Diagnosis Date   Anticoagulated    eliquis--- managed by cardiology   Anxiety and depression    GERD (gastroesophageal reflux disease)    History of adenomatous polyp of colon    History of COVID-19 07/2020   per pt severe symptoms but breathing issues, recovered at home, no hospitzations   Hypertension    Insomnia    Low vitamin D level    OSA on CPAP    PAF (paroxysmal atrial fibrillation) ([place]) 02/2021   cardiologist--- dr c. Quentin Ore;    s/p EP ablation afib 12-26-2021 residual post tachypalpitations   Pulmonary nodule    incidental finding on cardiac CT 12-19-2021   Tachycardia     Home Medications Prior to Admission medications   Medication Sig Start Date End Date Taking? Authorizing Provider  potassium chloride (KLOR-CON) 10 MEQ tablet Take 1 tablet (10 mEq total) by mouth 2 (two) times daily. 06/05/22  Yes Davonna Belling, MD  apixaban (ELIQUIS) 5 MG TABS tablet Take 1 tablet (5 mg total) by mouth 2 (two) times daily. 11/25/21   Vickie Epley, MD  hydrochlorothiazide (HYDRODIURIL) 25 MG tablet Take 25 mg by mouth daily. 09/23/19   [provider]  MULTAQ 400 MG tablet TAKE 1 TABLET (400 MG TOTAL) BY MOUTH 2 (TWO) TIMES DAILY WITH A MEAL. Patient taking differently: Take 400 mg by mouth 2 (two) times daily with a meal. 02/20/22   Fenton, Clint R, PA  norethindrone (MICRONOR) 0.35 MG tablet Take 1 tablet by mouth daily.    [provider]  pantoprazole (PROTONIX) 40 MG tablet Take 1 tablet (40 mg total) by mouth daily. Patient taking differently: Take 40 mg by mouth daily. 06/18/21   Ladene Artist, MD      Allergies    Patient has no known allergies.    Review of Systems   Review of Systems  Cardiovascular:  Positive for syncope.    Physical Exam Updated Vital Signs BP 121/79 (BP Location: Left Arm)   Pulse 84   Temp 98.4 F (36.9 C) (Oral)   Resp 18   LMP  (LMP Unknown) Comment: micronor  SpO2 99%  Physical Exam Vitals and nursing note reviewed.  HENT:     Mouth/Throat:     Comments: Laceration to lower lip.  Approximately 1-1/2 to 2 cm on skin side horizontally just a little below the vermilion border.  Also has a curved laceration on the  mucosal side that is also about 1/2 cm long.  It is curved this likely through and through with the skin wound. Eyes:     Extraocular Movements: Extraocular movements intact.     Pupils: Pupils are equal, round, and reactive to light.  Cardiovascular:     Rate and Rhythm: Regular rhythm.  Musculoskeletal:     Cervical back: Neck supple. No tenderness.  Skin:    Capillary Refill: Capillary refill takes less than 2 seconds.  Neurological:     General: No focal deficit present.     Mental Status: She is alert and oriented to person, place, and time.     ED Results / Procedures / Treatments   Labs (all labs ordered are listed, but only abnormal results are displayed) Labs Reviewed  COMPREHENSIVE METABOLIC PANEL - Abnormal; Notable for the following  components:      Result Value   Potassium 3.0 (*)    CO2 19 (*)    Creatinine, Ser 1.08 (*)    AST 80 (*)    Anion gap 17 (*)    All other components within normal limits  CBC WITH DIFFERENTIAL/PLATELET - Abnormal; Notable for the following components:   RBC 3.75 (*)    MCV 103.7 (*)    MCH 35.5 (*)    All other components within normal limits  ETHANOL - Abnormal; Notable for the following components:   Alcohol, Ethyl (B) 384 (*)    All other components within normal limits  TROPONIN I (HIGH SENSITIVITY)    EKG None  Radiology CT HEAD WO CONTRAST (5MM)  Result Date: 06/05/2022 CLINICAL DATA:  Fall EXAM: CT HEAD WITHOUT CONTRAST CT CERVICAL SPINE WITHOUT CONTRAST TECHNIQUE: Multidetector CT imaging of the head and cervical spine was performed following the standard protocol without intravenous contrast. Multiplanar CT image reconstructions of the cervical spine were also generated. RADIATION DOSE REDUCTION: This exam was performed according to the departmental dose-optimization program which includes automated exposure control, adjustment of the mA and/or kV according to patient size and/or use of iterative reconstruction technique. COMPARISON:  None Available. FINDINGS: CT HEAD FINDINGS Brain: No evidence of acute infarction, hemorrhage, hydrocephalus, extra-axial collection or mass lesion/mass effect. Vascular: No hyperdense vessel or unexpected calcification. Skull: Normal. Negative for fracture or focal lesion. Sinuses/Orbits: The visualized paranasal sinuses are essentially clear. The mastoid air cells are unopacified. Other: None. CT CERVICAL SPINE FINDINGS Alignment: Normal cervical lordosis. Skull base and vertebrae: No acute fracture. No primary bone lesion or focal pathologic process. Soft tissues and spinal canal: No prevertebral fluid or swelling. No visible canal hematoma. Disc levels: Intervertebral disc spaces are maintained. Spinal canal is patent. Upper chest: Visualized lung  apices are clear. Other: Visualized thyroid is unremarkable. IMPRESSION: Normal head CT. Normal cervical spine CT. Electronically Signed   By: Julian Hy M.D.   On: 06/05/2022 21:12   CT Cervical Spine Wo Contrast  Result Date: 06/05/2022 CLINICAL DATA:  Fall EXAM: CT HEAD WITHOUT CONTRAST CT CERVICAL SPINE WITHOUT CONTRAST TECHNIQUE: Multidetector CT imaging of the head and cervical spine was performed following the standard protocol without intravenous contrast. Multiplanar CT image reconstructions of the cervical spine were also generated. RADIATION DOSE REDUCTION: This exam was performed according to the departmental dose-optimization program which includes automated exposure control, adjustment of the mA and/or kV according to patient size and/or use of iterative reconstruction technique. COMPARISON:  None Available. FINDINGS: CT HEAD FINDINGS Brain: No evidence of acute infarction, hemorrhage, hydrocephalus, extra-axial collection or mass  lesion/mass effect. Vascular: No hyperdense vessel or unexpected calcification. Skull: Normal. Negative for fracture or focal lesion. Sinuses/Orbits: The visualized paranasal sinuses are essentially clear. The mastoid air cells are unopacified. Other: None. CT CERVICAL SPINE FINDINGS Alignment: Normal cervical lordosis. Skull base and vertebrae: No acute fracture. No primary bone lesion or focal pathologic process. Soft tissues and spinal canal: No prevertebral fluid or swelling. No visible canal hematoma. Disc levels: Intervertebral disc spaces are maintained. Spinal canal is patent. Upper chest: Visualized lung apices are clear. Other: Visualized thyroid is unremarkable. IMPRESSION: Normal head CT. Normal cervical spine CT. Electronically Signed   By: Julian Hy M.D.   On: 06/05/2022 21:12   DG Chest Portable 1 View  Result Date: 06/05/2022 CLINICAL DATA:  Fall. EXAM: PORTABLE CHEST 1 VIEW COMPARISON:  CT 12/19/2021 FINDINGS: The heart size and  mediastinal contours are within normal limits. Both lungs are clear. The visualized skeletal structures are unremarkable. IMPRESSION: No active disease. Electronically Signed   By: Lucienne Capers M.D.   On: 06/05/2022 20:40    Procedures Procedures    Medications Ordered in ED Medications  potassium chloride 10 mEq in 100 mL IVPB (10 mEq Intravenous New Bag/Given 06/05/22 2250)  Tdap (BOOSTRIX) injection 0.5 mL (0.5 mLs Intramuscular Given 06/05/22 2103)  lidocaine (PF) (XYLOCAINE) 1 % injection 5 mL (5 mLs Infiltration Given 06/05/22 2107)    ED Course/ Medical Decision Making/ A&P                           Medical Decision Making Amount and/or Complexity of Data Reviewed Labs: ordered. Radiology: ordered.  Risk Prescription drug management.  Patient syncopal episode.  Happened while at work.  Confused after.  History of atrial fibrillation but appears to be in sinus rhythm now.  Lab work reassuring but has a mild hypokalemia.  Will supplement IV.  States she has had issues with this in the past.  Also however is rather intoxicated.  Alcohol level 380.  Patient denied drinking alcohol.  This potentially could account for the confusion that she had after the episode.  Laceration of lip closed.  Discussed admission to the hospital since she has known arrhythmias and had syncope.  However patient refused.        Final Clinical Impression(s) / ED Diagnoses Final diagnoses:  Syncope, unspecified syncope type  Hypokalemia  Lip laceration, initial encounter  Alcoholic intoxication without complication (HCC)  Paroxysmal atrial fibrillation ([place])    Rx / DC Orders ED Discharge Orders          Ordered    potassium chloride (KLOR-CON) 10 MEQ tablet  2 times daily        06/05/22 2304    Ambulatory referral to Cardiology       Comments: If you have not heard from the Cardiology office within the next 72 hours please call [phone].   06/05/22 2305               Davonna Belling, MD 06/05/22 2306

## 2022-06-05 NOTE — ED Notes (Addendum)
Trauma Response Nurse Documentation   Katie Williams is a 56 y.o. female arriving to [facility] ED via [facility]  On Eliquis for atrial fibrillation. Trauma was activated as a Level 2 by Charge RN based on the following trauma criteria GCS 10-14 associated with trauma or AVPU < A. Trauma team at the bedside on patient arrival. Patient cleared for CT by Dr. Alvino Chapel. Patient to CT with team. GCS 15.  History   Past Medical History:  Diagnosis Date   Anticoagulated    eliquis--- managed by cardiology   Anxiety and depression    GERD (gastroesophageal reflux disease)    History of adenomatous polyp of colon    History of COVID-19 07/2020   per pt severe symptoms but breathing issues, recovered at home, no hospitzations   Hypertension    Insomnia    Low vitamin D level    OSA on CPAP    PAF (paroxysmal atrial fibrillation) ([place]) 02/2021   cardiologist--- dr c. Quentin Ore;    s/p EP ablation afib 12-26-2021 residual post tachypalpitations   Pulmonary nodule    incidental finding on cardiac CT 12-19-2021   Tachycardia      Past Surgical History:  Procedure Laterality Date   ATRIAL FIBRILLATION ABLATION N/A 12/26/2021   Procedure: ATRIAL FIBRILLATION ABLATION;  Surgeon: Vickie Epley, MD;  Location: [place] CV LAB;  Service: Cardiovascular;  Laterality: N/A;   CATARACT EXTRACTION W/ INTRAOCULAR LENS IMPLANT Bilateral 01/2020   COLONOSCOPY WITH PROPOFOL  01/28/2019   by dr stark   DILATATION & CURETTAGE/HYSTEROSCOPY WITH MYOSURE N/A 05/06/2022   Procedure: DILATATION & CURETTAGE/HYSTEROSCOPY WITH MYOSURE;  Surgeon: [name], MD;  Location: [place];  Service: Gynecology;  Laterality: N/A;   DILITATION & CURRETTAGE/HYSTROSCOPY WITH NOVASURE ABLATION N/A 05/06/2022   Procedure: DILATATION & CURETTAGE/HYSTEROSCOPY WITH NOVASURE ABLATION;  Surgeon: [name], MD;  Location: [place];  Service: Gynecology;  Laterality:  N/A;   ESOPHAGOGASTRODUODENOSCOPY  06/18/2021   by dr stark       Initial Focused Assessment (If applicable, or please see trauma documentation): See event summary.  CT's Completed:   CT Head and CT C-Spine   Interventions:  See event summary.  Plan for disposition:  Unknown at this time.  Consults completed:  none at 2140.  Event Summary: Patient brought in by [facility], patient was at work, standing, had a syncopal episode and fell face first onto carpet. Per EMS patient with repetitive questioning. Upon arrival patient with a small through an through laceration to her lower lip, appears to be where she bit her lip during the fall. Patient A&Ox4 answering questions appropriately, does require some redirection with simple requests. Airway intact, breathing spontaneous and unlabored. Patient had xray completed at bedside, TRN then took patient to CT.  Patient does endorse history of atrial fibrillation and on Eliquis for same.   Bedside handoff with ED RN Benjamine Mola.    Trudee Kuster  Trauma Response RN  Please call TRN at [phone] for further assistance.

## 2022-06-05 NOTE — ED Triage Notes (Signed)
Pt arrived from work via [employer] status post LOC and fall from standing position onto carpet face first. A and O x 4

## 2022-06-05 NOTE — ED Notes (Signed)
Date and time results received: 06/05/22 2204 (use smartphrase ".now" to insert current time)  Test: ethanol Critical Value: 384  Name of Provider Notified: N. Alvino Chapel, MD

## 2022-06-05 NOTE — Progress Notes (Signed)
Orthopedic Tech Progress Note Patient Details:  Katie Williams [facility] [birth year] [id number]  Patient ID: Katie Williams, female   DOB: [birth year], 56 y.o.   MRN: [id number] I attended trauma page with Benn Moulder 06/05/2022, 8:40 PM

## 2022-06-11 ENCOUNTER — Other Ambulatory Visit: Payer: 59

## 2022-06-11 DIAGNOSIS — I4819 Other persistent atrial fibrillation: Secondary | ICD-10-CM

## 2022-06-11 DIAGNOSIS — I480 Paroxysmal atrial fibrillation: Secondary | ICD-10-CM

## 2022-06-11 DIAGNOSIS — Z01812 Encounter for preprocedural laboratory examination: Secondary | ICD-10-CM

## 2022-06-11 LAB — BASIC METABOLIC PANEL
BUN/Creatinine Ratio: 8 — ABNORMAL LOW (ref 9–23)
BUN: 9 mg/dL (ref 6–24)
CO2: 21 mmol/L (ref 20–29)
Calcium: 10.3 mg/dL — ABNORMAL HIGH (ref 8.7–10.2)
Chloride: 100 mmol/L (ref 96–106)
Creatinine, Ser: 1.15 mg/dL — ABNORMAL HIGH (ref 0.57–1.00)
Glucose: 109 mg/dL — ABNORMAL HIGH (ref 70–99)
Potassium: 3.3 mmol/L — ABNORMAL LOW (ref 3.5–5.2)
Sodium: 141 mmol/L (ref 134–144)
eGFR: 57 mL/min/{1.73_m2} — ABNORMAL LOW (ref >59)

## 2022-06-11 LAB — CBC
Hematocrit: 38.1 % (ref 34.0–46.6)
Hemoglobin: 13.2 g/dL (ref 11.1–15.9)
MCH: 35.3 pg — ABNORMAL HIGH (ref 26.6–33.0)
MCHC: 34.6 g/dL (ref 31.5–35.7)
MCV: 102 fL — ABNORMAL HIGH (ref 79–97)
Platelets: 329 10*3/uL (ref 150–450)
RBC: 3.74 x10E6/uL — ABNORMAL LOW (ref 3.77–5.28)
RDW: 12.8 % (ref 11.7–15.4)
WBC: 7.5 10*3/uL (ref 3.4–10.8)

## 2022-06-13 ENCOUNTER — Inpatient Hospital Stay: Admission: RE | Admit: 2022-06-13 | Payer: 59 | Source: Ambulatory Visit

## 2022-06-13 ENCOUNTER — Other Ambulatory Visit: Payer: Self-pay | Admitting: Cardiology

## 2022-06-13 ENCOUNTER — Telehealth: Payer: Self-pay | Admitting: *Deleted

## 2022-06-13 MED ORDER — POTASSIUM CHLORIDE CRYS ER 20 MEQ PO TBCR: 20.0000 meq | EXTENDED_RELEASE_TABLET | Freq: Two times a day (BID) | ORAL | 6 refills | Status: DC

## 2022-06-13 NOTE — Telephone Encounter (Signed)
-----   Message from Vickie Epley, MD sent at 06/12/2022  6:11 PM EDT ----- Potassium is a little low still. Please have her increase the Kdur to 20 mEq twice daily.  Lysbeth Galas T. Quentin Ore, MD, [facility], [facility] Cardiac Electrophysiology

## 2022-06-13 NOTE — Telephone Encounter (Signed)
The patient has been notified of the result and verbalized understanding.  All questions (if any) were answered. Darrell Jewel, RN 06/13/2022 2:39 PM    Sent in medication increase

## 2022-06-13 NOTE — Telephone Encounter (Signed)
*  STAT* If patient is at the pharmacy, call can be transferred to refill team.   1. Which medications need to be refilled? (please list name of each medication and dose if known) potassium chloride (KLOR-CON) 10 MEQ tablet  2. Which pharmacy/location (including street and city if local pharmacy) is medication to be sent to? [id number]- [place], [place] - [address]  3. Do they need a 30 day or 90 day supply? [address]

## 2022-06-13 NOTE — Telephone Encounter (Signed)
Pt requesting a refill on potassium chloride. Would Dr. Quentin Ore like to refill this medication? Please address

## 2022-06-16 ENCOUNTER — Telehealth (HOSPITAL_COMMUNITY): Payer: Self-pay | Admitting: Emergency Medicine

## 2022-06-16 DIAGNOSIS — I480 Paroxysmal atrial fibrillation: Secondary | ICD-10-CM

## 2022-06-16 MED ORDER — METOPROLOL TARTRATE 100 MG PO TABS: 100.0000 mg | ORAL_TABLET | Freq: Once | ORAL | 0 refills | Status: DC

## 2022-06-16 NOTE — Telephone Encounter (Signed)
Attempted to call patient regarding upcoming cardiac CT appointment. °Left message on voicemail with name and callback number °Cher Franzoni RN Navigator Cardiac Imaging °[facility] °[phone] Office °[phone] Cell ° °

## 2022-06-16 NOTE — Progress Notes (Unsigned)
Cardiology Office Note Date:  06/16/2022  Patient ID:  Katie Williams, Katie Williams 1966-09-02, MRN [id number] PCP:  Waldemar Dickens, MD  Electrophysiologist: Dr. Quentin Ore  ***refresh   Chief Complaint: *** post hospital, syncope  History of Present Illness: Katie Williams is a 56 y.o. female with history of HTN, insomnia, sleep apnea, Afib, dilated ascending Ao  She comes in today to be seen for Dr. Quentin Ore, lasts seen by him May 2023, she had an ablation.  Unfortunately post procedure bothered by CP that was improved by Ibuprofen and had recurrent palpitations and AFib, prompting start of Multaq in April.  With this she had waht sounded like transient GI side effect. AT this visit discussed management strategies, she much preferred trying to avoid ongoing AAD and planned to pursue repeat ablation (scheduled 06/24/22)  05/06/22: underwent [place]   She had an ER visit 06/05/22 she was brought via EMS after a syncopal event that happened while at a movie. Notes report upon standing fainted falling forward hitting her lip causing a through and through injury, EMS reported some degree of perhaps confusion or repetitive questions. Laceration was repaired Mild hypokalemia was replaced, ETOH level was 384 though she denied drinking.  Offered admission for observation but she declined.  LABS K+ 3.0 BUN/Creat 9/1.08 H/H 13/38 Plts 336 ETOH 384 CT IMPRESSION: Normal head CT. Normal cervical spine CT. NO EKG but note reports she appeared to be in SR  F/u lab with K+ still only 3.3 and KDur 48mq BID added  *** hx of syncope? *** d/w CL, no plans for monitoring, likely triggered by intoxication *** repeat BMET with persistent hypokalemia *** eliquis, bleeding, vaginal or otherwise?   Device information Diagnosed April 2022 PVI ablation 12/26/21 Multaq started  March 2023  Past Medical History:  Diagnosis Date   Anticoagulated    eliquis--- managed by cardiology   Anxiety and depression    GERD (gastroesophageal reflux disease)    History of adenomatous polyp of colon    History of COVID-19 07/2020   per pt severe symptoms but breathing issues, recovered at home, no hospitzations   Hypertension    Insomnia    Low vitamin D level    OSA on CPAP    [place] 02[place]   cardiologist--- dr c. lQuentin Ore    s/p EP ablation afib 12-26-2021 residual post tachypalpitations   Pulmonary nodule    incidental finding on cardiac CT 12-19-2021   Tachycardia     Past Surgical History:  Procedure Laterality Date   ATRIAL FIBRILLATION ABLATION N/A 12/26/2021   Procedure: ATRIAL FIBRILLATION ABLATION;  Surgeon: LVickie Epley MD;  [facility];  Service: Cardiovascular;  Laterality: N/A;   CATARACT EXTRACTION W/ INTRAOCULAR LENS IMPLANT Bilateral 01/2020   COLONOSCOPY WITH PROPOFOL  01/28/2019   by dr stark   DILATATION & CURETTAGE/HYSTEROSCOPY WITH MYOSURE N/A 05/06/2022   Procedure: DILATATION & CURETTAGE/HYSTEROSCOPY WITH MYOSURE;  Surgeon: LPrincess Bruins MD;  [place]  Service: Gynecology;  Laterality: N/A;   DILITATION & CURRETTAGE/HYSTROSCOPY WITH NOVASURE ABLATION N/A 05/06/2022   Procedure: DILATATION & CURETTAGE/HYSTEROSCOPY WITH NOVASURE ABLATION;  Surgeon: LPrincess Bruins MD;  [place]  Service: Gynecology;  Laterality: N/A;   ESOPHAGOGASTRODUODENOSCOPY  06/18/2021   by dr stark    Current Outpatient Medications  Medication Sig Dispense Refill   apixaban (ELIQUIS) 5 MG TABS  tablet Take 1 tablet (5 mg total) by mouth 2 (two) times daily. 60 tablet 11   hydrochlorothiazide (HYDRODIURIL) 25 MG tablet Take 25 mg by mouth daily.     MULTAQ 400 MG tablet TAKE 1 TABLET (400 MG TOTAL) BY MOUTH 2 (TWO) TIMES DAILY WITH A MEAL.  (Patient taking differently: Take 400 mg by mouth 2 (two) times daily with a meal.) 60 tablet 11   norethindrone (MICRONOR) 0.35 MG tablet Take 1 tablet by mouth daily.     pantoprazole (PROTONIX) 40 MG tablet Take 1 tablet (40 mg total) by mouth daily. (Patient taking differently: Take 40 mg by mouth daily.) 90 tablet 3   potassium chloride SA (KLOR-CON M) 20 MEQ tablet Take 1 tablet (20 mEq total) by mouth 2 (two) times daily. 60 tablet 6   No current facility-administered medications for this visit.    Allergies:   Patient has no known allergies.   Social History:  The patient  reports that she has never smoked. She has never used smokeless tobacco. She reports current alcohol use of about 6.0 - 12.0 standard drinks of alcohol per week. She reports that she does not use drugs.   Family History:  The patient's family history includes Colon cancer (age of onset: 15) in her paternal grandfather; Colon cancer (age of onset: 9) in her paternal grandmother; Colon cancer (age of onset: 25) in her father; Colon polyps (age of onset: 51) in her brother; Hypertension in her father and mother.***  ROS:  Please see the history of present illness.    All other systems are reviewed and otherwise negative.   PHYSICAL EXAM:  VS:  There were no vitals taken for this visit. BMI: There is no height or weight on file to calculate BMI. Well nourished, well developed, in no acute distress HEENT: normocephalic, atraumatic Neck: no JVD, carotid bruits or masses Cardiac:  *** RRR; no significant murmurs, no rubs, or gallops Lungs:  *** CTA b/l, no wheezing, rhonchi or rales Abd: soft, nontender MS: no deformity or *** atrophy Ext: *** no edema Skin: warm and dry, no rash Neuro:  No gross deficits appreciated Psych: euthymic mood, full affect  EKG:  Done today and reviewed by myself shows  ***  12/26/21; EPS/ablation CONCLUSIONS: 1. Successful PVI 2. Successful ablation of the cavotricuspid isthmus for  typical appearing atrial flutter 3. Intracardiac echo reveals trivial pericardial effusion, normal LA architecture 4. No early apparent complications.   12/19/21: Cardiac CT IMPRESSION: 1. There is normal pulmonary vein drainage into the left atrium. 2. The left atrial appendage is large chicken wing / broccoli type with a single lobe and ostial size 23 x 18 mm and length 50 mm. There is no thrombus in the left atrial appendage. 3. The esophagus runs in the left atrial midline and is not in the proximity to any of the pulmonary veins. 4. Calcium score: Coronary calcium score of 0. This was 0 percentile for age-, race-, and sex-matched controls.   04/01/21: TTE  1. Left ventricular ejection fraction, by estimation, is 60 to 65%. The  left ventricle has normal function. The left ventricle has no regional  wall motion abnormalities. There is mild asymmetric left ventricular  hypertrophy of the posterior segment.  Left ventricular diastolic parameters are indeterminate.   2. Right ventricular systolic function is normal. The right ventricular  size is normal.   3. The mitral valve is normal in structure. No evidence of mitral valve  regurgitation. No evidence  of mitral stenosis.   4. The aortic valve is normal in structure. Aortic valve regurgitation is  not visualized. No aortic stenosis is present.   5. Aortic dilatation noted. There is mild dilatation of the ascending  aorta, measuring 37 mm.   6. The inferior vena cava is normal in size with greater than 50%  respiratory variability, suggesting right atrial pressure of 3 mmHg.   Comparison(s): No prior Echocardiogram.   Recent Labs: 06/05/2022: ALT 40 06/11/2022: BUN 9; Creatinine, Ser 1.15; Hemoglobin 13.2; Platelets 329; Potassium 3.3; Sodium 141  No results found for requested labs within last 365 days.   Estimated Creatinine Clearance: 65.2 mL/min (A) (by C-G formula based on SCr of 1.15 mg/dL (H)).   Wt Readings from Last 3  Encounters:  06/05/22 195 lb 8.8 oz (88.7 kg)  05/06/22 195 lb 9.6 oz (88.7 kg)  03/25/22 196 lb 6.4 oz (89.1 kg)     Other studies reviewed: Additional studies/records reviewed today include: summarized above  ASSESSMENT AND PLAN:  Paroxysmal Afib CHA2DS2Vasc is 2 on Eliquis, *** appropriately dosed *** Planned for repeat ablation  Sleep apnea ***  syncope ***  Disposition: F/u with ***  Current medicines are reviewed at length with the patient today.  The patient did not have any concerns regarding medicines.  Venetia Night, PA-C 06/16/2022 9:12 AM     [facility] [address] [place] [address] [phone] (office)  [phone] (fax)

## 2022-06-16 NOTE — Telephone Encounter (Signed)
Reaching out to patient to offer assistance regarding upcoming cardiac imaging study; pt verbalizes understanding of appt date/time, parking situation and where to check in, pre-test NPO status and medications ordered, and verified current allergies; name and call back number provided for further questions should they arise Katie Bond RN [place] [facility] [phone] office [phone] cell  Denies iv issues Arrival 100 w/c entrance '100mg'$  metoprolol tartrate

## 2022-06-17 ENCOUNTER — Ambulatory Visit (HOSPITAL_COMMUNITY)
Admission: RE | Admit: 2022-06-17 | Discharge: 2022-06-17 | Disposition: A | Discharge status: Home / Self Care | Payer: 59 | Source: Ambulatory Visit | Attending: Cardiology | Admitting: Cardiology

## 2022-06-17 DIAGNOSIS — I4819 Other persistent atrial fibrillation: Secondary | ICD-10-CM

## 2022-06-17 MED ORDER — IOHEXOL 350 MG/ML SOLN
95.0000 mL | Freq: Once | INTRAVENOUS | Status: AC | PRN
  Administered 2022-06-17: 95 mL via INTRAVENOUS

## 2022-06-19 ENCOUNTER — Ambulatory Visit: Payer: 59 | Admitting: Physician Assistant

## 2022-06-19 ENCOUNTER — Encounter: Payer: Self-pay | Admitting: Physician Assistant

## 2022-06-19 VITALS — BP 142/88 | HR 79 | Ht 68.0 in | Wt 201.0 lb

## 2022-06-19 DIAGNOSIS — R55 Syncope and collapse: Secondary | ICD-10-CM

## 2022-06-19 DIAGNOSIS — I4819 Other persistent atrial fibrillation: Secondary | ICD-10-CM | POA: Diagnosis not present

## 2022-06-19 LAB — BASIC METABOLIC PANEL
BUN/Creatinine Ratio: 9 (ref 9–23)
BUN: 9 mg/dL (ref 6–24)
CO2: 21 mmol/L (ref 20–29)
Calcium: 9.5 mg/dL (ref 8.7–10.2)
Chloride: 106 mmol/L (ref 96–106)
Creatinine, Ser: 1.03 mg/dL — ABNORMAL HIGH (ref 0.57–1.00)
Glucose: 90 mg/dL (ref 70–99)
Potassium: 5 mmol/L (ref 3.5–5.2)
Sodium: 145 mmol/L — ABNORMAL HIGH (ref 134–144)
eGFR: 65 mL/min/{1.73_m2} (ref >59)

## 2022-06-19 NOTE — Patient Instructions (Addendum)
Medication Instructions:   Your physician recommends that you continue on your current medications as directed. Please refer to the Current Medication list given to you today.  *If you need a refill on your cardiac medications before your next appointment, please call your pharmacy*   Lab Work:  BMET TODAY   If you have labs (blood work) drawn today and your tests are completely normal, you will receive your results only by: [place] (if you have MyChart) OR A paper copy in the mail If you have any lab test that is abnormal or we need to change your treatment, we will call you to review the results.   Testing/Procedures: Your physician has recommended that you have a Cardioversion (DCCV). Electrical Cardioversion uses a jolt of electricity to your heart either through paddles or wired patches attached to your chest. This is a controlled, usually prescheduled, procedure. Defibrillation is done under light anesthesia in the hospital, and you usually go home the day of the procedure. This is done to get your heart back into a normal rhythm. You are not awake for the procedure. Please see the instruction sheet given to you today.   Follow-Up: At [facility], you and your health needs are our priority.  As part of our continuing mission to provide you with exceptional heart care, we have created designated Provider Care Teams.  These Care Teams include your primary Cardiologist (physician) and Advanced Practice Providers (APPs -  Physician Assistants and Nurse Practitioners) who all work together to provide you with the care you need, when you need it.  We recommend signing up for the patient portal called "MyChart".  Sign up information is provided on this After Visit Summary.  MyChart is used to connect with patients for Virtual Visits (Telemedicine).  Patients are able to view lab/test results, encounter notes, upcoming appointments, etc.  Non-urgent messages can be sent to your provider  as well.   To learn more about what you can do with MyChart, go to [email or web address].    Your next appointment:   AS SCHEDULED   The format for your next appointment:   In Person  Provider:   Lars Mage, MD    Other Instructions   Important Information About [place]

## 2022-06-24 ENCOUNTER — Other Ambulatory Visit: Payer: Self-pay

## 2022-06-24 ENCOUNTER — Ambulatory Visit (HOSPITAL_COMMUNITY): Payer: 59 | Admitting: Anesthesiology

## 2022-06-24 ENCOUNTER — Ambulatory Visit (HOSPITAL_BASED_OUTPATIENT_CLINIC_OR_DEPARTMENT_OTHER): Payer: 59 | Admitting: Anesthesiology

## 2022-06-24 ENCOUNTER — Encounter (HOSPITAL_COMMUNITY)
Admission: RE | Disposition: A | Discharge status: Home / Self Care | Payer: 59 | Source: Home / Self Care | Attending: Cardiology

## 2022-06-24 ENCOUNTER — Other Ambulatory Visit (HOSPITAL_COMMUNITY): Payer: Self-pay

## 2022-06-24 ENCOUNTER — Ambulatory Visit (HOSPITAL_COMMUNITY)
Admission: RE | Admit: 2022-06-24 | Discharge: 2022-06-24 | Disposition: A | Discharge status: Home / Self Care | Payer: 59 | Attending: Cardiology | Admitting: Cardiology

## 2022-06-24 ENCOUNTER — Encounter (HOSPITAL_COMMUNITY): Payer: Self-pay | Admitting: Cardiology

## 2022-06-24 DIAGNOSIS — I10 Essential (primary) hypertension: Secondary | ICD-10-CM

## 2022-06-24 DIAGNOSIS — I480 Paroxysmal atrial fibrillation: Secondary | ICD-10-CM | POA: Insufficient documentation

## 2022-06-24 DIAGNOSIS — I4891 Unspecified atrial fibrillation: Secondary | ICD-10-CM | POA: Diagnosis not present

## 2022-06-24 DIAGNOSIS — Z7901 Long term (current) use of anticoagulants: Secondary | ICD-10-CM | POA: Diagnosis not present

## 2022-06-24 DIAGNOSIS — G4733 Obstructive sleep apnea (adult) (pediatric): Secondary | ICD-10-CM | POA: Insufficient documentation

## 2022-06-24 HISTORY — PX: ATRIAL FIBRILLATION ABLATION: EP1191

## 2022-06-24 LAB — BASIC METABOLIC PANEL
Anion gap: 12 (ref 5–15)
BUN: 9 mg/dL (ref 6–20)
CO2: 22 mmol/L (ref 22–32)
Calcium: 9.7 mg/dL (ref 8.9–10.3)
Chloride: 108 mmol/L (ref 98–111)
Creatinine, Ser: 1.06 mg/dL — ABNORMAL HIGH (ref 0.44–1.00)
GFR, Estimated: >60 mL/min (ref >60)
Glucose, Bld: 90 mg/dL (ref 70–99)
Potassium: 3.6 mmol/L (ref 3.5–5.1)
Sodium: 142 mmol/L (ref 135–145)

## 2022-06-24 LAB — PREGNANCY, URINE: Preg Test, Ur: NEGATIVE

## 2022-06-24 LAB — POCT ACTIVATED CLOTTING TIME
Activated Clotting Time: 305 seconds
Activated Clotting Time: 353 seconds

## 2022-06-24 SURGERY — ATRIAL FIBRILLATION ABLATION
Anesthesia: General

## 2022-06-24 MED ORDER — HEPARIN SODIUM (PORCINE) 1000 UNIT/ML IJ SOLN
INTRAMUSCULAR | Status: DC | PRN
  Administered 2022-06-24: 1000 [IU] via INTRAVENOUS

## 2022-06-24 MED ORDER — ONDANSETRON HCL 4 MG/2ML IJ SOLN: 4.0000 mg | Freq: Four times a day (QID) | INTRAMUSCULAR | Status: DC | PRN

## 2022-06-24 MED ORDER — ADENOSINE 6 MG/2ML IV SOLN
INTRAVENOUS | Status: AC
  Filled 2022-06-24: qty 6

## 2022-06-24 MED ORDER — DEXAMETHASONE SODIUM PHOSPHATE 10 MG/ML IJ SOLN
INTRAMUSCULAR | Status: DC | PRN
  Administered 2022-06-24: 10 mg via INTRAVENOUS

## 2022-06-24 MED ORDER — FENTANYL CITRATE (PF) 250 MCG/5ML IJ SOLN
INTRAMUSCULAR | Status: DC | PRN
  Administered 2022-06-24: 50 ug via INTRAVENOUS

## 2022-06-24 MED ORDER — MIDAZOLAM HCL 2 MG/2ML IJ SOLN
INTRAMUSCULAR | Status: DC | PRN
  Administered 2022-06-24: 2 mg via INTRAVENOUS

## 2022-06-24 MED ORDER — COLCHICINE 0.6 MG PO TABS
0.6000 mg | ORAL_TABLET | Freq: Two times a day (BID) | ORAL | 0 refills | Status: DC
  Filled 2022-06-24: qty 10, 5d supply, fill #0

## 2022-06-24 MED ORDER — LIDOCAINE 2% (20 MG/ML) 5 ML SYRINGE
INTRAMUSCULAR | Status: DC | PRN
  Administered 2022-06-24: 100 mg via INTRAVENOUS

## 2022-06-24 MED ORDER — PROPOFOL 10 MG/ML IV BOLUS
INTRAVENOUS | Status: DC | PRN
  Administered 2022-06-24: 150 mg via INTRAVENOUS

## 2022-06-24 MED ORDER — PHENYLEPHRINE HCL-NACL 20-0.9 MG/250ML-% IV SOLN
INTRAVENOUS | Status: DC | PRN
  Administered 2022-06-24: 25 ug/min via INTRAVENOUS

## 2022-06-24 MED ORDER — PROTAMINE SULFATE 10 MG/ML IV SOLN
INTRAVENOUS | Status: DC | PRN
  Administered 2022-06-24: 10 mg via INTRAVENOUS
  Administered 2022-06-24: 20 mg via INTRAVENOUS

## 2022-06-24 MED ORDER — SUGAMMADEX SODIUM 200 MG/2ML IV SOLN: INTRAVENOUS | Status: DC | PRN

## 2022-06-24 MED ORDER — COLCHICINE 0.6 MG PO TABS
0.6000 mg | ORAL_TABLET | Freq: Two times a day (BID) | ORAL | Status: DC
  Administered 2022-06-24: 0.6 mg via ORAL
  Filled 2022-06-24: qty 1

## 2022-06-24 MED ORDER — ISOPROTERENOL HCL 0.2 MG/ML IJ SOLN
INTRAMUSCULAR | Status: AC
  Filled 2022-06-24: qty 5

## 2022-06-24 MED ORDER — HEPARIN (PORCINE) IN NACL 1000-0.9 UT/500ML-% IV SOLN
INTRAVENOUS | Status: DC | PRN
  Administered 2022-06-24 (×3): 500 mL

## 2022-06-24 MED ORDER — ISOPROTERENOL HCL 0.2 MG/ML IJ SOLN
INTRAVENOUS | Status: DC | PRN
  Administered 2022-06-24: 2 ug/min via INTRAVENOUS

## 2022-06-24 MED ORDER — HEPARIN SODIUM (PORCINE) 1000 UNIT/ML IJ SOLN
INTRAMUSCULAR | Status: AC
  Filled 2022-06-24: qty 10

## 2022-06-24 MED ORDER — SUGAMMADEX SODIUM 200 MG/2ML IV SOLN
INTRAVENOUS | Status: DC | PRN
  Administered 2022-06-24: 200 mg via INTRAVENOUS

## 2022-06-24 MED ORDER — ACETAMINOPHEN 325 MG PO TABS: 650.0000 mg | ORAL_TABLET | ORAL | Status: DC | PRN

## 2022-06-24 MED ORDER — ADENOSINE 6 MG/2ML IV SOLN
INTRAVENOUS | Status: DC | PRN
  Administered 2022-06-24: 12 mg via INTRAVENOUS
  Administered 2022-06-24: 18 mg via INTRAVENOUS

## 2022-06-24 MED ORDER — ADENOSINE 6 MG/2ML IV SOLN
INTRAVENOUS | Status: AC
  Filled 2022-06-24: qty 2

## 2022-06-24 MED ORDER — HEPARIN SODIUM (PORCINE) 1000 UNIT/ML IJ SOLN
INTRAMUSCULAR | Status: DC | PRN
  Administered 2022-06-24: 13000 [IU] via INTRAVENOUS
  Administered 2022-06-24: 4000 [IU] via INTRAVENOUS

## 2022-06-24 MED ORDER — SODIUM CHLORIDE 0.9 % IV SOLN: INTRAVENOUS | Status: DC

## 2022-06-24 MED ORDER — APIXABAN 5 MG PO TABS
5.0000 mg | ORAL_TABLET | Freq: Two times a day (BID) | ORAL | Status: DC
  Administered 2022-06-24: 5 mg via ORAL
  Filled 2022-06-24: qty 1

## 2022-06-24 MED ORDER — ONDANSETRON HCL 4 MG/2ML IJ SOLN
INTRAMUSCULAR | Status: DC | PRN
  Administered 2022-06-24: 4 mg via INTRAVENOUS

## 2022-06-24 MED ORDER — SODIUM CHLORIDE 0.9% FLUSH: 3.0000 mL | Freq: Two times a day (BID) | INTRAVENOUS | Status: DC

## 2022-06-24 MED ORDER — SODIUM CHLORIDE 0.9% FLUSH
3.0000 mL | INTRAVENOUS | Status: DC | PRN
Start: 2022-06-24 — End: 2022-06-24

## 2022-06-24 MED ORDER — PANTOPRAZOLE SODIUM 40 MG PO TBEC
40.0000 mg | DELAYED_RELEASE_TABLET | Freq: Every day | ORAL | Status: DC
  Administered 2022-06-24: 40 mg via ORAL
  Filled 2022-06-24: qty 1

## 2022-06-24 MED ORDER — ROCURONIUM BROMIDE 10 MG/ML (PF) SYRINGE
PREFILLED_SYRINGE | INTRAVENOUS | Status: DC | PRN
  Administered 2022-06-24: 70 mg via INTRAVENOUS

## 2022-06-24 MED ORDER — SODIUM CHLORIDE 0.9 % IV SOLN
250.0000 mL | INTRAVENOUS | Status: DC | PRN
Start: 2022-06-24 — End: 2022-06-24

## 2022-06-24 SURGICAL SUPPLY — 17 items
CATH ABLAT QDOT MICRO BI TC FJ (CATHETERS) ×1 IMPLANT
CATH OCTARAY 2.0 F 3-3-3-3-3 (CATHETERS) ×1 IMPLANT
CATH S CIRCA THERM PROBE 10F (CATHETERS) ×1 IMPLANT
CATH SOUNDSTAR ECO 8FR (CATHETERS) ×1 IMPLANT
CATH WEBSTER BI DIR CS D-F CRV (CATHETERS) ×1 IMPLANT
CLOSURE PERCLOSE PROSTYLE (VASCULAR PRODUCTS) ×3 IMPLANT
COVER SWIFTLINK CONNECTOR (BAG) ×2 IMPLANT
PACK EP LATEX FREE (CUSTOM PROCEDURE TRAY) ×2
PACK EP LF (CUSTOM PROCEDURE TRAY) ×1 IMPLANT
PAD DEFIB RADIO PHYSIO CONN (PAD) ×2 IMPLANT
PATCH CARTO3 (PAD) ×2 IMPLANT
SHEATH BAYLIS TRANSSEPTAL 98CM (NEEDLE) ×1 IMPLANT
SHEATH CARTO VIZIGO SM CVD (SHEATH) ×1 IMPLANT
SHEATH PINNACLE 8F 10CM (SHEATH) ×2 IMPLANT
SHEATH PINNACLE 9F 10CM (SHEATH) ×1 IMPLANT
SHEATH PROBE COVER 6X72 (BAG) ×1 IMPLANT
TUBING SMART ABLATE COOLFLOW (TUBING) ×1 IMPLANT

## 2022-06-24 NOTE — Transfer of Care (Signed)
Immediate Anesthesia Transfer of Care Note  Patient: Katie Williams  Procedure(s) Performed: ATRIAL FIBRILLATION ABLATION  Patient Location: Cath Lab  Anesthesia Type:General  Level of Consciousness: awake, alert  and oriented  Airway & Oxygen Therapy: Patient Spontanous Breathing  Post-op Assessment: Report given to RN and Post -op Vital signs reviewed and stable  Post vital signs: Reviewed and stable  Last Vitals:  Vitals Value Taken Time  BP 132/86 06/24/22 0943  Temp 36.7 C 06/24/22 0944  Pulse 76 06/24/22 0946  Resp 19 06/24/22 0946  SpO2 97 % 06/24/22 0946  Vitals shown include unvalidated device data.  Last Pain:  Vitals:   06/24/22 0944  TempSrc: Temporal  PainSc: 0-No pain      Patients Stated Pain Goal: 4 (56/72/09 1980)  Complications: There were no known notable events for this encounter.

## 2022-06-24 NOTE — Progress Notes (Signed)
Dr. Quentin Ore in to talk w/patient

## 2022-06-24 NOTE — Anesthesia Preprocedure Evaluation (Signed)
Anesthesia Evaluation  Patient identified by MRN, date of birth, ID band Patient awake    Reviewed: Allergy & Precautions, NPO status , Patient's Chart, lab work & pertinent test results  Airway Mallampati: II  TM Distance: >3 FB Neck ROM: Full    Dental no notable dental hx.    Pulmonary neg pulmonary ROS,    Pulmonary exam normal breath sounds clear to auscultation       Cardiovascular hypertension, Normal cardiovascular exam+ dysrhythmias Atrial Fibrillation  Rhythm:Irregular Rate:Normal     Neuro/Psych negative neurological ROS  negative psych ROS   GI/Hepatic Neg liver ROS, GERD  ,  Endo/Other  negative endocrine ROS  Renal/GU negative Renal ROS  negative genitourinary   Musculoskeletal negative musculoskeletal ROS (+)   Abdominal   Peds negative pediatric ROS (+)  Hematology negative hematology ROS (+)   Anesthesia Other Findings   Reproductive/Obstetrics negative OB ROS                             Anesthesia Physical Anesthesia Plan  ASA: 3  Anesthesia Plan: General   Post-op Pain Management: Minimal or no pain anticipated   Induction: Intravenous  PONV Risk Score and Plan: 3 and Ondansetron, Dexamethasone, Midazolam and Treatment may vary due to age or medical condition  Airway Management Planned: Oral ETT  Additional Equipment:   Intra-op Plan:   Post-operative Plan: Extubation in OR  Informed Consent: I have reviewed the patients History and Physical, chart, labs and discussed the procedure including the risks, benefits and alternatives for the proposed anesthesia with the patient or authorized representative who has indicated his/her understanding and acceptance.     Dental advisory given  Plan Discussed with: CRNA and Surgeon  Anesthesia Plan Comments:         Anesthesia Quick Evaluation

## 2022-06-24 NOTE — Anesthesia Postprocedure Evaluation (Signed)
Anesthesia Post Note  Patient: Katie Williams  Procedure(s) Performed: ATRIAL FIBRILLATION ABLATION     Patient location during evaluation: PACU Anesthesia Type: General Level of consciousness: awake and alert Pain management: pain level controlled Vital Signs Assessment: post-procedure vital signs reviewed and stable Respiratory status: spontaneous breathing, nonlabored ventilation, respiratory function stable and patient connected to nasal cannula oxygen Cardiovascular status: blood pressure returned to baseline and stable Postop Assessment: no apparent nausea or vomiting Anesthetic complications: no   There were no known notable events for this encounter.  Last Vitals:  Vitals:   06/24/22 1010 06/24/22 1015  BP: 123/88 123/79  Pulse: 79 80  Resp: 11 12  Temp:  36.8 C  SpO2: 96% 96%    Last Pain:  Vitals:   06/24/22 1015  TempSrc: Temporal  PainSc:                  Nagi Furio S

## 2022-06-24 NOTE — H&P (Signed)
Electrophysiology Office Follow up Visit Note:     Date:  06/24/2022    ID:  Katie Williams, DOB Apr 04, 1966, MRN [id number]   PCP:  Waldemar Dickens, MD     [place] Cardiologist:  None  [facility] Electrophysiologist:  Vickie Epley, MD      Interval History:     Katie Williams is a 56 y.o. female who presents for a follow up visit. They were last seen in clinic 11/07/2021.   Since their last appointment, they underwent atrial fibrillation ablation on 12/26/2021. Following her ablation she did have significant chest discomfort. She was started on ibuprofen which resolved her symptoms.   She followed up with Katie Williams 01/28/2022. She continued to have frequent tachypalpitations several days a week, and sometimes multiple times a day. Her Apple watch EKG tracings were reviewed which did show Afib with RVR and SR. Her EKG in clinic showed ectopic atrial rhythm at 97 bpm. After shared decision making she was started on Multaq 400 mg BID. Repeat EKG on 3/28 showed SR, short PR 102, QRS 118, QTc 463. Since starting Multaq she developed some GI side effects which resolved. She denied episodes of Afib at that time.   Overall, she is feeling good. However, she reports her arrhythmic episodes are still present but less severe.    Following her ablation she confirms that her chest pain at that time lasted for several days.   Just prior to her surgery she started bleeding, and had difficulty getting it to stop. It did stop for a few weeks, but recurred recently and seems persistent.   She denies any chest pain, shortness of breath, or peripheral edema. No lightheadedness, headaches, syncope, orthopnea, or PND.   Presents for PVI today. AF has been slightly better in the last few weeks.      Objective      Past Medical History:  Diagnosis Date   Anxiety and depression     Atrial fibrillation ([place]) 03/04/2021   Cataract     COVID-19     Hypertension     Insomnia      Low vitamin D level     Sleep apnea     Tubular adenoma of colon 2014           Past Surgical History:  Procedure Laterality Date   ATRIAL FIBRILLATION ABLATION N/A 12/26/2021    Procedure: ATRIAL FIBRILLATION ABLATION;  Surgeon: Vickie Epley, MD;  Location: [facility];  Service: Cardiovascular;  Laterality: N/A;   COLONOSCOPY   last 01/01/2016   no prior surgery       POLYPECTOMY          Current Medications: Active Medications      Current Meds  Medication Sig   apixaban (ELIQUIS) 5 MG TABS tablet Take 1 tablet (5 mg total) by mouth 2 (two) times daily.   hydrochlorothiazide (HYDRODIURIL) 25 MG tablet Take 25 mg by mouth daily.   MULTAQ 400 MG tablet TAKE 1 TABLET (400 MG TOTAL) BY MOUTH 2 (TWO) TIMES DAILY WITH A MEAL.   Norethindrone Acetate-Ethinyl Estrad-FE (LOESTRIN 24 FE) 1-20 MG-MCG(24) tablet Take 1 tablet by mouth daily.   pantoprazole (PROTONIX) 40 MG tablet Take 1 tablet (40 mg total) by mouth daily.        Allergies:   Patient has no known allergies.    Social History         Socioeconomic History   Marital status: Married  Spouse name: Not on file   Number of children: Not on file   Years of education: Not on file   Highest education level: Not on file  Occupational History   Not on file  Tobacco Use   Smoking status: Never   Smokeless tobacco: Never   Tobacco comments:      Never smoke 01/28/2022  Vaping Use   Vaping Use: Never used  Substance and Sexual Activity   Alcohol use: Yes      Alcohol/week: 6.0 - 12.0 standard drinks      Types: 6 - 12 Glasses of wine per week      Comment: 2-3 glasses a sitting drinks 3-4 days out of week 09/17/2021   Drug use: No   Sexual activity: Yes      Partners: Male      Birth control/protection: Pill      Comment: 1st intercourse- 20, partners- refursed,  married- 25 yrs   Other Topics Concern   Not on file  Social History Narrative   Not on file    Social Determinants of Health     Financial Resource Strain: Not on file  Food Insecurity: Not on file  Transportation Needs: Not on file  Physical Activity: Not on file  Stress: Not on file  Social Connections: Not on file      Family History: The patient's family history includes Colon cancer (age of onset: 47) in her paternal grandfather; Colon cancer (age of onset: 43) in her paternal grandmother; Colon cancer (age of onset: 52) in her father; Colon polyps (age of onset: 44) in her brother; Hypertension in her father and mother. There is no history of Stomach cancer, Esophageal cancer, or Rectal cancer.   ROS:   Please see the history of present illness.    (+) Easy bleeding All other systems reviewed and are negative.   EKGs/Labs/Other Studies Reviewed:     The following studies were reviewed today:   12/26/2021   Atrial Fibrillation Ablation: CONCLUSIONS: 1. Successful PVI 2. Successful ablation of the cavotricuspid isthmus for typical appearing atrial flutter 3. Intracardiac echo reveals trivial pericardial effusion, normal LA architecture 4. No early apparent complications.   12/19/2021   Cardiac CTA: FINDINGS: A 120 kV prospective scan was triggered in the descending thoracic aorta at 111 HU's. Gantry rotation speed was 280 msecs and collimation was .9 mm. No beta blockade and no NTG was given. The 3D data set was reconstructed in 5% intervals of the 0-90% of the R-R cycle. Diastolic phases were analyzed on a dedicated work station using MPR, MIP and VRT modes. The patient received 80 cc of contrast.   There is normal pulmonary vein drainage into the left atrium (2 on the right and 2 on the left) with ostial measurements as follows:   RSPV: 24.6 x 16.5 mm, area 2.50 cm2   RIPV: 15.3 x 14.3 mm, area 1.67 cm2   LSPV: 22.0 x 14.7, area 2.50 cm2   LIPV: 18.5 x 13.7 mm, area 1.91 cm2   The left atrial appendage is large chicken wing / broccoli type with a single lobe and ostial size 23 x 18 mm and  length 50 mm. There is no thrombus in the left atrial appendage.   The esophagus runs in the left atrial midline and is not in the proximity to any of the pulmonary veins.   Aorta:  Normal caliber.  No dissection or calcifications.   Aortic Valve:  Trileaflet.  No calcifications.  Coronary Arteries: Normal coronary origin. Right dominance. The study was performed without use of NTG and insufficient for plaque evaluation.   Calcium score: Coronary calcium score of 0. This was 0 percentile for age-, race-, and sex-matched controls.   IMPRESSION: 1. There is normal pulmonary vein drainage into the left atrium.   2. The left atrial appendage is large chicken wing / broccoli type with a single lobe and ostial size 23 x 18 mm and length 50 mm. There is no thrombus in the left atrial appendage.   3. The esophagus runs in the left atrial midline and is not in the proximity to any of the pulmonary veins.   4. Calcium score: Coronary calcium score of 0. This was 0 percentile for age-, race-, and sex-matched controls.   04/01/2021 Echo LV normal RV normal No MR     11/07/2021 ECG shows  sinus rhythm.  No definite preexcitation.  The PR interval is 130 ms. 09/30/2021 ECG shows  ectopic atrial rhyth vs junctional rhythm 09/17/2021 ECG shows sinus rhythm with short PR. No obvious preexcitation   EKG:  EKG is personally reviewed.  03/25/2022: Sinus rhythm with a short PR.     Recent Labs: 12/11/2021: Hemoglobin 13.9; Platelets 265 12/26/2021: BUN 10; Creatinine, Ser 1.01; Potassium 4.2; Sodium 140    Recent Lipid Panel Labs (Brief)  No results found for: CHOL, TRIG, HDL, CHOLHDL, VLDL, LDLCALC, LDLDIRECT     Physical Exam:     VS:  BP 120/83   Pulse 79   Ht '5\' 8"'$  (1.727 m)   Wt 196 lb 6.4 oz (89.1 kg)   SpO2 96%   BMI 29.86 kg/m         Wt Readings from Last 3 Encounters:  03/25/22 196 lb 6.4 oz (89.1 kg)  01/28/22 200 lb (90.7 kg)  12/26/21 197 lb (89.4 kg)       GEN: Well nourished, well developed in no acute distress HEENT: Normal NECK: No JVD; No carotid bruits LYMPHATICS: No lymphadenopathy CARDIAC: RRR, no murmurs, rubs, gallops RESPIRATORY:  Clear to auscultation without rales, wheezing or rhonchi  ABDOMEN: Soft, non-tender, non-distended MUSCULOSKELETAL:  No edema; No deformity  SKIN: Warm and dry NEUROLOGIC:  Alert and oriented x 3 PSYCHIATRIC:  Normal affect            Assessment ASSESSMENT:     1. OSA on CPAP   2. Paroxysmal atrial fibrillation (HCC)     PLAN:     In order of problems listed above:     #Paroxysmal atrial fibrillation She had an ablation December 26, 2021 but is continued to have symptomatic salvos of atrial fibrillation.  She is on Eliquis for stroke prophylaxis.  She has a chads Vascor of 2 for hypertension and gender.  I discussed treatment options for her going forward including continued antiarrhythmic drug, alternative antiarrhythmic drug and catheter ablation.  She would like to avoid medications if at all possible and would like to pursue redo catheter ablation.  I think this is very reasonable.  We will get this scheduled for her. I discussed the procedure in detail.  I discussed the need for repeat CT scan prior to the procedure.   Risk, benefits, and alternatives to EP study and radiofrequency ablation for afib were also discussed in detail today. These risks include but are not limited to stroke, bleeding, vascular damage, tamponade, perforation, damage to the esophagus, lungs, and other structures, pulmonary vein stenosis, worsening renal function, and death. The patient  understands these risk and wishes to proceed.  We will therefore proceed with catheter ablation at the next available time.  Carto, ICE, anesthesia are requested for the procedure.  Will also obtain CT PV protocol prior to the procedure to exclude LAA thrombus and further evaluate atrial anatomy.   She will need to be on a blood  thinner for at least 4 weeks prior to the ablation procedure and at least for 3 months after the ablation procedure.   We will treat her after the ablation with colchicine prophylactically.  Plan for redo PVI and EPS today. Procedure reviewed.       Signed, Lars Mage, MD, [facility], [facility] 06/24/2022 Electrophysiology [facility]

## 2022-06-24 NOTE — Discharge Instructions (Signed)
Post procedure care instructions No driving for 4 days. No lifting over 5 lbs for 1 week. No vigorous or sexual activity for 1 week. You may return to work/your usual activities on 07/02/22. Keep procedure site clean & dry. If you notice increased pain, swelling, bleeding or pus, call/return!  You may shower after 24 hours, but no soaking in baths/hot tubs/pools for 1 week.    You have an appointment set up with the [facility].  Multiple studies have shown that being followed by a dedicated atrial fibrillation clinic in addition to the standard care you receive from your other physicians improves health. We believe that enrollment in the atrial fibrillation clinic will allow [place] to better care for you.   The phone number to the [facility] is [phone]. The clinic is staffed Monday through Friday from 8:30am to 5pm.  Parking Directions: The clinic is located in the Heart and Vascular Building connected to [facility]. 1)From [address] turn on to [address] and go to the 3rd entrance  (Heart and Vascular entrance) on the right. 2)Look to the right for Heart &Vascular Parking Garage. 3)A code for the entrance is required, for Sept is 1502.   4)Take the elevators to the 1st floor. Registration is in the room with the glass walls at the end of the hallway.  If you have any trouble parking or locating the clinic, please don't hesitate to call [phone].

## 2022-06-24 NOTE — Anesthesia Procedure Notes (Signed)
Procedure Name: Intubation Date/Time: 06/24/2022 7:39 AM  Performed by: Carolan Clines, CRNAPre-anesthesia Checklist: Patient identified, Emergency Drugs available, Suction available and Patient being monitored Patient Re-evaluated:Patient Re-evaluated prior to induction Oxygen Delivery Method: Circle System Utilized Preoxygenation: Pre-oxygenation with 100% oxygen Induction Type: IV induction Ventilation: Mask ventilation without difficulty Laryngoscope Size: Mac and 3 Grade View: Grade II Tube type: Oral Tube size: 7.0 mm Number of attempts: 1 Airway Equipment and Method: Stylet Placement Confirmation: ETT inserted through vocal cords under direct vision, positive ETCO2 and breath sounds checked- equal and bilateral Secured at: 22 cm Tube secured with: Tape Dental Injury: Teeth and Oropharynx as per pre-operative assessment

## 2022-06-25 ENCOUNTER — Encounter (HOSPITAL_COMMUNITY): Payer: Self-pay | Admitting: Cardiology

## 2022-06-25 NOTE — Progress Notes (Signed)
Client up and walked and tolerated well; bilat groins stable, no bleeding or hematoma; Dr Quentin Ore in and ok to d/c home

## 2022-06-28 ENCOUNTER — Other Ambulatory Visit: Payer: Self-pay | Admitting: Obstetrics & Gynecology

## 2022-06-30 NOTE — Telephone Encounter (Signed)
Refill request received for Megace.   Call placed to patient. No longer taking Megace, currently on Micronor. Megace Rx not needed. Advised will refuse request from pharmacy. Patient appreciative of call.   Routing to provider for final review. Patient is agreeable to disposition. Will close encounter.

## 2022-07-22 ENCOUNTER — Encounter (HOSPITAL_COMMUNITY): Payer: Self-pay | Admitting: Physician Assistant

## 2022-07-22 ENCOUNTER — Ambulatory Visit (HOSPITAL_COMMUNITY)
Admission: RE | Admit: 2022-07-22 | Discharge: 2022-07-22 | Disposition: A | Discharge status: Home / Self Care | Payer: 59 | Source: Ambulatory Visit | Attending: Physician Assistant | Admitting: Physician Assistant

## 2022-07-22 VITALS — BP 128/90 | HR 91 | Ht 68.0 in | Wt 199.2 lb

## 2022-07-22 DIAGNOSIS — I480 Paroxysmal atrial fibrillation: Secondary | ICD-10-CM | POA: Diagnosis present

## 2022-07-22 DIAGNOSIS — G4733 Obstructive sleep apnea (adult) (pediatric): Secondary | ICD-10-CM | POA: Diagnosis not present

## 2022-07-22 DIAGNOSIS — I483 Typical atrial flutter: Secondary | ICD-10-CM | POA: Insufficient documentation

## 2022-07-22 DIAGNOSIS — I10 Essential (primary) hypertension: Secondary | ICD-10-CM | POA: Diagnosis not present

## 2022-07-22 DIAGNOSIS — Z7901 Long term (current) use of anticoagulants: Secondary | ICD-10-CM | POA: Diagnosis not present

## 2022-07-22 NOTE — Progress Notes (Signed)
Primary Care Physician: Waldemar Dickens, MD Primary Cardiologist: none Primary Electrophysiologist: Dr Quentin Ore Referring Physician: Dr Vito Berger Katie Williams is a 56 y.o. female with a history of ascending aorta dilatation, HTN, OSA, atrial fibrillation who presents for follow up in the [facility].  The patient was initially diagnosed with atrial fibrillation on her smart watch with symptoms of tachypalpitations and chest discomfort on 03/01/21. The episode resolved by the time she sought care the following day. Patient has a CHADS2VASC score of 2. She brings in her Apple Watch strips which do show true afib (personally reviewed). She has had 3 additional episodes. She drinks ~4 glasses of wine per week and does admit to snoring and daytime somnolence. Started on metoprolol 09/17/21.  Patient is s/p afib and flutter ablation with Dr Quentin Ore on 12/26/21. She did have significant chest discomfort post ablation and was started on ibuprofen which resolved the symptoms. She has continued to have frequent symptoms of tachypalpitations, several days per week, sometimes multiple times per day. She was started on Multaq which did improve her symptoms and is now s/p repeat ablation 06/24/22.  On follow up today, patient reports that she has done very well since the ablation. She has not had any episodes of afib. She denies any CP, swallowing pain, or groin issues.   Today, she denies symptoms of palpitations, chest pain, shortness of breath, orthopnea, PND, lower extremity edema, dizziness, presyncope, syncope, bleeding, or neurologic sequela. The patient is tolerating medications without difficulties and is otherwise without complaint today.    Atrial Fibrillation Risk Factors:  she does have symptoms or diagnosis of sleep apnea. she is compliant with CPAP therapy.  she does not have a history of rheumatic fever. she does have a history of alcohol use. The patient  does not have a history of early familial atrial fibrillation or other arrhythmias.  she has a BMI of Body mass index is 30.29 kg/m.Marland Kitchen Filed Weights   07/22/22 1057  Weight: 90.4 kg    Family History  Problem Relation Age of Onset   Colon cancer Father 65   Hypertension Father    Colon cancer Paternal Grandmother 9   Colon cancer Paternal Grandfather 60   Colon polyps Brother 11       precancerous polyps   Hypertension Mother    Stomach cancer Neg Hx    Esophageal cancer Neg Hx    Rectal cancer Neg Hx      Atrial Fibrillation Management history:  Previous antiarrhythmic drugs: Multaq Previous cardioversions: none Previous ablations: 12/26/21, 06/24/22 CHADS2VASC score: 2 Anticoagulation history: Eliquis   Past Medical History:  Diagnosis Date   Anticoagulated    eliquis--- managed by cardiology   Anxiety and depression    GERD (gastroesophageal reflux disease)    History of adenomatous polyp of colon    History of COVID-19 07/2020   per pt severe symptoms but breathing issues, recovered at home, no hospitzations   Hypertension    Insomnia    Low vitamin D level    OSA on CPAP    PAF (paroxysmal atrial fibrillation) ([place]) 02/2021   cardiologist--- dr c. Quentin Ore;    s/p EP ablation afib 12-26-2021 residual post tachypalpitations   Pulmonary nodule    incidental finding on cardiac CT 12-19-2021   Tachycardia    Past Surgical History:  Procedure Laterality Date   ATRIAL FIBRILLATION ABLATION N/A 12/26/2021   Procedure: ATRIAL FIBRILLATION ABLATION;  Surgeon: Lars Mage  T, MD;  Location: [facility];  Service: Cardiovascular;  Laterality: N/A;   ATRIAL FIBRILLATION ABLATION N/A 06/24/2022   Procedure: ATRIAL FIBRILLATION ABLATION;  Surgeon: Vickie Epley, MD;  Location: [facility];  Service: Cardiovascular;  Laterality: N/A;   CATARACT EXTRACTION W/ INTRAOCULAR LENS IMPLANT Bilateral 01/2020   COLONOSCOPY WITH PROPOFOL  01/28/2019   by dr  stark   DILATATION & CURETTAGE/HYSTEROSCOPY WITH MYOSURE N/A 05/06/2022   Procedure: DILATATION & CURETTAGE/HYSTEROSCOPY WITH MYOSURE;  Surgeon: [name], MD;  Location: [facility];  Service: Gynecology;  Laterality: N/A;   DILITATION & CURRETTAGE/HYSTROSCOPY WITH NOVASURE ABLATION N/A 05/06/2022   Procedure: DILATATION & CURETTAGE/HYSTEROSCOPY WITH NOVASURE ABLATION;  Surgeon: [name], MD;  Location: [facility];  Service: Gynecology;  Laterality: N/A;   ESOPHAGOGASTRODUODENOSCOPY  06/18/2021   by dr stark    Current Outpatient Medications  Medication Sig Dispense Refill   apixaban (ELIQUIS) 5 MG TABS tablet Take 1 tablet (5 mg total) by mouth 2 (two) times daily. 60 tablet 11   hydrochlorothiazide (HYDRODIURIL) 25 MG tablet Take 25 mg by mouth in the morning.     MULTAQ 400 MG tablet TAKE 1 TABLET (400 MG TOTAL) BY MOUTH 2 (TWO) TIMES DAILY WITH A MEAL. 60 tablet 11   norethindrone (MICRONOR) 0.35 MG tablet Take 1 tablet by mouth in the morning.     pantoprazole (PROTONIX) 40 MG tablet Take 1 tablet (40 mg total) by mouth daily. 90 tablet 3   potassium chloride SA (KLOR-CON M) 20 MEQ tablet Take 1 tablet (20 mEq total) by mouth 2 (two) times daily. 60 tablet 6   No current facility-administered medications for this encounter.    No Known Allergies  Social History   Socioeconomic History   Marital status: Married    Spouse name: Not on file   Number of children: Not on file   Years of education: Not on file   Highest education level: Not on file  Occupational History   Not on file  Tobacco Use   Smoking status: Never   Smokeless tobacco: Never   Tobacco comments:    Never smoke 07/22/22  Vaping Use   Vaping Use: Never used  Substance and Sexual Activity   Alcohol use: Yes    Alcohol/week: 6.0 - 12.0 standard drinks of alcohol    Types: 6 - 12 Glasses of wine per week    Comment: 6 glasses of wine weekly 07/22/22   Drug  use: No   Sexual activity: Yes    Partners: Male    Birth control/protection: Pill    Comment: 1st intercourse- 89, micronor  Other Topics Concern   Not on file  Social History Narrative   Not on file   Social Determinants of Health   Financial Resource Strain: Not on file  Food Insecurity: Not on file  Transportation Needs: Not on file  Physical Activity: Not on file  Stress: Not on file  Social Connections: Not on file  Intimate Partner Violence: Not on file     ROS- All systems are reviewed and negative except as per the HPI above.  Physical Exam: Vitals:   07/22/22 1057  BP: (!) 128/90  Pulse: 91  Weight: 90.4 kg  Height: '5\' 8"'$  (1.727 m)    GEN- The patient is a well appearing female, alert and oriented x 3 today.   HEENT-head normocephalic, atraumatic, sclera clear, conjunctiva pink, hearing intact, trachea midline. Lungs- Clear to ausculation  bilaterally, normal work of breathing Heart- Regular rate and rhythm, no murmurs, rubs or gallops  GI- soft, NT, ND, + BS Extremities- no clubbing, cyanosis, or edema MS- no significant deformity or atrophy Skin- no rash or lesion Psych- euthymic mood, full affect Neuro- strength and sensation are intact   Wt Readings from Last 3 Encounters:  07/22/22 90.4 kg  06/24/22 86.2 kg  06/19/22 91.2 kg    EKG today demonstrates  Ectopic atrial rhythm Vent. rate 91 BPM PR interval 116 ms QRS duration 88 ms QT/QTcB 352/432 ms  Echo 04/01/21 demonstrated   1. Left ventricular ejection fraction, by estimation, is 60 to 65%. The  left ventricle has normal function. The left ventricle has no regional  wall motion abnormalities. There is mild asymmetric left ventricular  hypertrophy of the posterior segment. Left ventricular diastolic parameters are indeterminate.   2. Right ventricular systolic function is normal. The right ventricular  size is normal.   3. The mitral valve is normal in structure. No evidence of mitral  valve  regurgitation. No evidence of mitral stenosis.   4. The aortic valve is normal in structure. Aortic valve regurgitation is  not visualized. No aortic stenosis is present.   5. Aortic dilatation noted. There is mild dilatation of the ascending  aorta, measuring 37 mm.   6. The inferior vena cava is normal in size with greater than 50%  respiratory variability, suggesting right atrial pressure of 3 mmHg.   Epic records are reviewed at length today  CHA2DS2-VASc Score = 2  The patient's score is based upon: CHF History: 0 HTN History: 1 Diabetes History: 0 Stroke History: 0 Vascular Disease History: 0 Age Score: 0 Gender Score: 1       ASSESSMENT AND PLAN: 1. Paroxysmal Atrial Fibrillation/typical atrial flutter The patient's CHA2DS2-VASc score is 2, indicating a 2.2% annual risk of stroke.   S/p afib and flutter ablation 12/26/21 with repeat ablation 06/24/22 Patient appears to be maintaining SR. Continue Multaq 400 mg BID for now. Hopefully this will be short term post ablation. Continue Eliquis 5 mg BID with no missed doses for 3 months post ablation Apple watch for home monitoring.   2. OSA Patient reports compliance with CPAP therapy.  3. HTN Stable, no changes today.   Follow up with Dr Quentin Ore as scheduled.    [facility] [address] [place], [place] [address] [phone] 07/22/2022 11:08 AM

## 2022-08-05 ENCOUNTER — Other Ambulatory Visit: Payer: Self-pay | Admitting: Gastroenterology

## 2022-08-18 ENCOUNTER — Other Ambulatory Visit: Payer: Self-pay | Admitting: Obstetrics & Gynecology

## 2022-08-18 DIAGNOSIS — Z1231 Encounter for screening mammogram for malignant neoplasm of breast: Secondary | ICD-10-CM

## 2022-09-16 ENCOUNTER — Ambulatory Visit
Admission: RE | Admit: 2022-09-16 | Discharge: 2022-09-16 | Disposition: A | Discharge status: Home / Self Care | Payer: 59 | Source: Ambulatory Visit | Attending: Obstetrics & Gynecology | Admitting: Obstetrics & Gynecology

## 2022-09-16 DIAGNOSIS — Z1231 Encounter for screening mammogram for malignant neoplasm of breast: Secondary | ICD-10-CM

## 2022-09-23 ENCOUNTER — Encounter: Payer: Self-pay | Admitting: Cardiology

## 2022-09-23 ENCOUNTER — Ambulatory Visit: Payer: 59 | Attending: Cardiology | Admitting: Cardiology

## 2022-09-23 VITALS — BP 128/84 | HR 84 | Ht 68.0 in | Wt 183.0 lb

## 2022-09-23 DIAGNOSIS — I4819 Other persistent atrial fibrillation: Secondary | ICD-10-CM

## 2022-09-23 DIAGNOSIS — G4733 Obstructive sleep apnea (adult) (pediatric): Secondary | ICD-10-CM

## 2022-09-23 MED ORDER — APIXABAN 5 MG PO TABS: 5.0000 mg | ORAL_TABLET | Freq: Two times a day (BID) | ORAL | 11 refills | Status: DC

## 2022-09-23 MED ORDER — APIXABAN 5 MG PO TABS: 5.0000 mg | ORAL_TABLET | Freq: Two times a day (BID) | ORAL | 3 refills | Status: DC

## 2022-09-23 NOTE — Patient Instructions (Signed)
Medication Instructions:  Stop Multaq *If you need a refill on your cardiac medications before your next appointment, please call your pharmacy*   Lab Work: None  If you have labs (blood work) drawn today and your tests are completely normal, you will receive your results only by: [place] (if you have MyChart) OR A paper copy in the mail If you have any lab test that is abnormal or we need to change your treatment, we will call you to review the results.   Testing/Procedures: None    Follow-Up: At [facility], you and your health needs are our priority.  As part of our continuing mission to provide you with exceptional heart care, we have created designated Provider Care Teams.  These Care Teams include your primary Cardiologist (physician) and Advanced Practice Providers (APPs -  Physician Assistants and Nurse Practitioners) who all work together to provide you with the care you need, when you need it.  We recommend signing up for the patient portal called "MyChart".  Sign up information is provided on this After Visit Summary.  MyChart is used to connect with patients for Virtual Visits (Telemedicine).  Patients are able to view lab/test results, encounter notes, upcoming appointments, etc.  Non-urgent messages can be sent to your provider as well.   To learn more about what you can do with MyChart, go to [email or web address].    Your next appointment:   6 month(s)  The format for your next appointment:   In Person  Provider:   You will see one of the following Advanced Practice Providers on your designated Care Team:   Tommye Standard, [place] Legrand Como "Jonni Sanger" Chalmers Cater, [place]      Other Instructions None   Important Information About Sugar

## 2022-09-23 NOTE — Progress Notes (Signed)
Electrophysiology Office Follow up Visit Note:    Date:  09/23/2022   ID:  Katie Williams, DOB 1966/04/20, MRN [id number]  PCP:  Waldemar Dickens, MD  [place] Cardiologist:  None  [facility] Electrophysiologist:  Katie Epley, MD    Interval History:    Katie Williams is a 56 y.o. female who presents for a follow up visit. They were last seen in clinic 03/25/2022 where we planned for EP study plus Isopril and redo PVI. This was performed 06/24/2022.  She followed up with Adline Peals, PA on 07/22/2022 where she was doing well without recurrent episodes of atrial fibrillation. Her EKG showed ectopic atrial rhythm at 91 bpm. Continued Multaq 400 mg BID for now. Continue Eliquis 5 mg BID with no missed doses for 3 months post ablation.  Today, she confirms there have been no recurring episodes of atrial fibrillation. Occasionally she is aware of her ectopic rhythms, but only by detection of her smart watch. She denies having palpitations.  She has successfully lost 21 lbs, and plans to lose 20 more. For exercise she will walk 3-5 miles a day. She has made significant changes to follow a healthy diet. Also she has stopped drinking alcohol.   She denies any chest pain, shortness of breath, or peripheral edema. No lightheadedness, headaches, syncope, orthopnea, or PND.      Past Medical History:  Diagnosis Date   Anticoagulated    eliquis--- managed by cardiology   Anxiety and depression    GERD (gastroesophageal reflux disease)    History of adenomatous polyp of colon    History of COVID-19 07/2020   per pt severe symptoms but breathing issues, recovered at home, no hospitzations   Hypertension    Insomnia    Low vitamin D level    OSA on CPAP    PAF (paroxysmal atrial fibrillation) ([place]) 02/2021   cardiologist--- dr c. Quentin Ore;    s/p EP ablation afib 12-26-2021 residual post tachypalpitations   Pulmonary nodule    incidental finding on cardiac CT 12-19-2021    Tachycardia     Past Surgical History:  Procedure Laterality Date   ATRIAL FIBRILLATION ABLATION N/A 12/26/2021   Procedure: ATRIAL FIBRILLATION ABLATION;  Surgeon: Katie Epley, MD;  Location: [facility];  Service: Cardiovascular;  Laterality: N/A;   ATRIAL FIBRILLATION ABLATION N/A 06/24/2022   Procedure: ATRIAL FIBRILLATION ABLATION;  Surgeon: Katie Epley, MD;  Location: [facility];  Service: Cardiovascular;  Laterality: N/A;   CATARACT EXTRACTION W/ INTRAOCULAR LENS IMPLANT Bilateral 01/2020   COLONOSCOPY WITH PROPOFOL  01/28/2019   by dr stark   DILATATION & CURETTAGE/HYSTEROSCOPY WITH MYOSURE N/A 05/06/2022   Procedure: DILATATION & CURETTAGE/HYSTEROSCOPY WITH MYOSURE;  Surgeon: [name], MD;  Location: [facility];  Service: Gynecology;  Laterality: N/A;   DILITATION & CURRETTAGE/HYSTROSCOPY WITH NOVASURE ABLATION N/A 05/06/2022   Procedure: DILATATION & CURETTAGE/HYSTEROSCOPY WITH NOVASURE ABLATION;  Surgeon: [name], MD;  Location: [facility];  Service: Gynecology;  Laterality: N/A;   ESOPHAGOGASTRODUODENOSCOPY  06/18/2021   by dr stark    Current Medications: Current Meds  Medication Sig   hydrochlorothiazide (HYDRODIURIL) 25 MG tablet Take 25 mg by mouth in the morning.   norethindrone (MICRONOR) 0.35 MG tablet Take 1 tablet by mouth in the morning.   pantoprazole (PROTONIX) 40 MG tablet Take 1 tablet (40 mg total) by mouth daily.   potassium chloride SA (KLOR-CON M) 20 MEQ tablet Take  1 tablet (20 mEq total) by mouth 2 (two) times daily.   [DISCONTINUED] apixaban (ELIQUIS) 5 MG TABS tablet Take 1 tablet (5 mg total) by mouth 2 (two) times daily.   [DISCONTINUED] MULTAQ 400 MG tablet TAKE 1 TABLET (400 MG TOTAL) BY MOUTH 2 (TWO) TIMES DAILY WITH A MEAL.     Allergies:   Patient has no known allergies.   Social History   Socioeconomic History   Marital status: Married    Spouse name: Not on  file   Number of children: Not on file   Years of education: Not on file   Highest education level: Not on file  Occupational History   Not on file  Tobacco Use   Smoking status: Never   Smokeless tobacco: Never   Tobacco comments:    Never smoke 07/22/22  Vaping Use   Vaping Use: Never used  Substance and Sexual Activity   Alcohol use: Yes    Alcohol/week: 6.0 - 12.0 standard drinks of alcohol    Types: 6 - 12 Glasses of wine per week    Comment: 6 glasses of wine weekly 07/22/22   Drug use: No   Sexual activity: Yes    Partners: Male    Birth control/protection: Pill    Comment: 1st intercourse- 39, micronor  Other Topics Concern   Not on file  Social History Narrative   Not on file   Social Determinants of Health   Financial Resource Strain: Not on file  Food Insecurity: Not on file  Transportation Needs: Not on file  Physical Activity: Not on file  Stress: Not on file  Social Connections: Not on file     Family History: The patient's family history includes Colon cancer (age of onset: 87) in her paternal grandfather; Colon cancer (age of onset: 55) in her paternal grandmother; Colon cancer (age of onset: 60) in her father; Colon polyps (age of onset: 69) in her brother; Hypertension in her father and mother. There is no history of Stomach cancer, Esophageal cancer, Rectal cancer, or Breast cancer.  ROS:   Please see the history of present illness.    All other systems reviewed and are negative.  EKGs/Labs/Other Studies Reviewed:    The following studies were reviewed today:  Echo 04/01/2021:  1. Left ventricular ejection fraction, by estimation, is 60 to 65%. The  left ventricle has normal function. The left ventricle has no regional  wall motion abnormalities. There is mild asymmetric left ventricular  hypertrophy of the posterior segment.  Left ventricular diastolic parameters are indeterminate.   2. Right ventricular systolic function is normal. The right  ventricular  size is normal.   3. The mitral valve is normal in structure. No evidence of mitral valve  regurgitation. No evidence of mitral stenosis.   4. The aortic valve is normal in structure. Aortic valve regurgitation is  not visualized. No aortic stenosis is present.   5. Aortic dilatation noted. There is mild dilatation of the ascending  aorta, measuring 37 mm.   6. The inferior vena cava is normal in size with greater than 50%  respiratory variability, suggesting right atrial pressure of 3 mmHg.   Comparison(s): No prior Echocardiogram.    EKG:  EKG is personally reviewed.  09/23/2022:  sinus rhythm.  Recent Labs: 06/05/2022: ALT 40 06/11/2022: Hemoglobin 13.2; Platelets 329 06/24/2022: BUN 9; Creatinine, Ser 1.06; Potassium 3.6; Sodium 142   Recent Lipid Panel No results found for: "CHOL", "TRIG", "HDL", "CHOLHDL", "VLDL", "LDLCALC", "LDLDIRECT"  Physical Exam:    VS:  BP 128/84   Pulse 84   Ht '5\' 8"'$  (1.727 m)   Wt 183 lb (83 kg)   SpO2 98%   BMI 27.83 kg/m     Wt Readings from Last 3 Encounters:  09/23/22 183 lb (83 kg)  07/22/22 199 lb 3.2 oz (90.4 kg)  06/24/22 190 lb (86.2 kg)     GEN: Well nourished, well developed in no acute distress HEENT: Normal NECK: No JVD; No carotid bruits LYMPHATICS: No lymphadenopathy CARDIAC: RRR, no murmurs, rubs, gallops RESPIRATORY:  Clear to auscultation without rales, wheezing or rhonchi  ABDOMEN: Soft, non-tender, non-distended MUSCULOSKELETAL:  No edema; No deformity  SKIN: Warm and dry NEUROLOGIC:  Alert and oriented x 3 PSYCHIATRIC:  Normal affect        ASSESSMENT:    1. Persistent atrial fibrillation ([place])   2. OSA on CPAP    PLAN:    In order of problems listed above:  #Paroxysmal atrial fibrillation Doing well after PVI/CTI 12/26/2021 and 06/24/2022.  Continue Eliquis. Stop Multaq.  #OSA on CPAP  Follow-up in 6 months.      Medication Adjustments/Labs and Tests Ordered: Current medicines  are reviewed at length with the patient today.  Concerns regarding medicines are outlined above.   Orders Placed This Encounter  Procedures   EKG 12-Lead   Meds ordered this encounter  Medications   DISCONTD: apixaban (ELIQUIS) 5 MG TABS tablet    Sig: Take 1 tablet (5 mg total) by mouth 2 (two) times daily.    Dispense:  60 tablet    Refill:  11   apixaban (ELIQUIS) 5 MG TABS tablet    Sig: Take 1 tablet (5 mg total) by mouth 2 (two) times daily.    Dispense:  180 tablet    Refill:  3   I,Mathew Stumpf,acting as a scribe for Katie Epley, MD.,have documented all relevant documentation on the behalf of Katie Epley, MD,as directed by  Katie Epley, MD while in the presence of Katie Epley, MD.  I, Katie Epley, MD, have reviewed all documentation for this visit. The documentation on 09/23/22 for the exam, diagnosis, procedures, and orders are all accurate and complete.   Signed, Lars Mage, MD, [facility], [facility] 09/23/2022 8:06 PM    Electrophysiology [facility]

## 2022-10-22 ENCOUNTER — Other Ambulatory Visit: Payer: Self-pay | Admitting: Obstetrics & Gynecology

## 2022-10-23 ENCOUNTER — Other Ambulatory Visit (HOSPITAL_COMMUNITY)
Admission: RE | Admit: 2022-10-23 | Discharge: 2022-10-23 | Disposition: A | Discharge status: Home / Self Care | Payer: 59 | Source: Ambulatory Visit | Attending: Obstetrics & Gynecology | Admitting: Obstetrics & Gynecology

## 2022-10-23 ENCOUNTER — Encounter: Payer: Self-pay | Admitting: Obstetrics & Gynecology

## 2022-10-23 ENCOUNTER — Ambulatory Visit (INDEPENDENT_AMBULATORY_CARE_PROVIDER_SITE_OTHER): Payer: 59 | Admitting: Obstetrics & Gynecology

## 2022-10-23 VITALS — BP 122/82 | HR 101 | Resp 16 | Ht 68.25 in | Wt 168.0 lb

## 2022-10-23 DIAGNOSIS — N951 Menopausal and female climacteric states: Secondary | ICD-10-CM | POA: Diagnosis not present

## 2022-10-23 DIAGNOSIS — Z30011 Encounter for initial prescription of contraceptive pills: Secondary | ICD-10-CM

## 2022-10-23 DIAGNOSIS — Z01419 Encounter for gynecological examination (general) (routine) without abnormal findings: Secondary | ICD-10-CM | POA: Diagnosis present

## 2022-10-23 MED ORDER — ESTRADIOL 0.1 MG/GM VA CREA: 1.0000 | TOPICAL_CREAM | VAGINAL | 4 refills | Status: DC

## 2022-10-23 MED ORDER — NORETHIN ACE-ETH ESTRAD-FE 1-20 MG-MCG(24) PO TABS: 1.0000 | ORAL_TABLET | Freq: Every day | ORAL | 4 refills | Status: DC

## 2022-10-23 NOTE — Progress Notes (Signed)
Katie Williams [facility] 1966/06/29 [id number]   History:    56 y.o.  G0 Married.  Works for [employer].   RP:  Established patient presenting for annual gyn exam    HPI: S/P HSC/Myosure Excision/D+C/Novasure Endometrial Ablation on 05/06/22.  No menses or BTB.  Well on the Progestin pill, except for hot flushes increasing and night sweats most nights, but FSH low at 2.5 on 04/03/22, so not menopausal.  No pelvic pain. Using Estrace cream for dryness with intercourse.  Pap Neg in 06/2020.  Pap reflex today.  Urine normal.  Bowel movements normal. Breasts normal. Mammo Neg 09/2022.  BMI improved to 25.36.  Very good muscle mass.  Very fit, going to the Gym.  Healthy nutrition. Health labs with Fam MD.  Katie Williams, q3 yrs. Fam h/o Colon Cancer.  Pt to get flu vaccine at work  Past medical history,surgical history, family history and social history were all reviewed and documented in the EPIC chart.  Gynecologic History No LMP recorded. (Menstrual status: Oral contraceptives).  Obstetric History OB History  Gravida Para Term Preterm AB Living  0 0 0 0 0 0  SAB IAB Ectopic Multiple Live Births  0 0 0 0 0     ROS: A ROS was performed and pertinent positives and negatives are included in the history. GENERAL: No fevers or chills. HEENT: No change in vision, no earache, sore throat or sinus congestion. NECK: No pain or stiffness. CARDIOVASCULAR: No chest pain or pressure. No palpitations. PULMONARY: No shortness of breath, cough or wheeze. GASTROINTESTINAL: No abdominal pain, nausea, vomiting or diarrhea, melena or bright red blood per rectum. GENITOURINARY: No urinary frequency, urgency, hesitancy or dysuria. MUSCULOSKELETAL: No joint or muscle pain, no back pain, no recent trauma. DERMATOLOGIC: No rash, no itching, no lesions. ENDOCRINE: No polyuria, polydipsia, no heat or cold intolerance. No recent change in weight. HEMATOLOGICAL: No anemia or easy bruising or bleeding. NEUROLOGIC: No headache,  seizures, numbness, tingling or weakness. PSYCHIATRIC: No depression, no loss of interest in normal activity or change in sleep pattern.     Exam:  BP 122/82   Pulse (!) 101   Resp 16   Ht 5' 8.25" (1.734 m)   Wt 168 lb (76.2 kg)   SpO2 99%   BMI 25.36 kg/m   Body mass index is 25.36 kg/m.  General appearance : Well developed well nourished female. No acute distress HEENT: Eyes: no retinal hemorrhage or exudates,  Neck supple, trachea midline, no carotid bruits, no thyroidmegaly Lungs: Clear to auscultation, no rhonchi or wheezes, or rib retractions  Heart: Regular rate and rhythm, no murmurs or gallops Breast:Examined in sitting and supine position were symmetrical in appearance, no palpable masses or tenderness,  no skin retraction, no nipple inversion, no nipple discharge, no skin discoloration, no axillary or supraclavicular lymphadenopathy Abdomen: no palpable masses or tenderness, no rebound or guarding Extremities: no edema or skin discoloration or tenderness  Pelvic: Vulva: Normal             Vagina: No gross lesions or discharge  Cervix: No gross lesions or discharge.  Pap reflex done.  Uterus  AV, mildly nodular with Fibroid, non-tender and mobile  Adnexa  Without masses or tenderness  Anus: Normal   Assessment/Plan:  56 y.o. female for annual exam   1. Encounter for routine gynecological examination with Papanicolaou smear of cervix S/P HSC/Myosure Excision/D+C/Novasure Endometrial Ablation on 05/06/22.  No menses or BTB.  Well on the Progestin pill, except for  hot flushes increasing and night sweats most nights, but FSH low at 2.5 on 04/03/22, so not menopausal.  No pelvic pain. Using Estrace cream for dryness with intercourse.  Pap Neg in 06/2020.  Pap reflex today.  Urine normal.  Bowel movements normal. Breasts normal. Mammo Neg 09/2022.  BMI improved to 25.36.  Very good muscle mass.  Very fit, going to the Gym.  Healthy nutrition. Health labs with Fam MD.  Katie Williams  Williams, q3 yrs. Fam h/o Colon Cancer.  Pt to get flu vaccine at work - Cytology - PAP( [place])  2. Perimenopause S/P HSC/Myosure Excision/D+C/Novasure Endometrial Ablation on 05/06/22.  No menses or BTB.  Well on the Progestin pill, except for hot flushes increasing and night sweats most nights, but FSH low at 2.5 on 04/03/22, so not menopausal.  No pelvic pain. Using Estrace cream for dryness with intercourse.   3. Encounter for initial prescription of contraceptive pills Well on the Progestin pill, except for hot flushes increasing and night sweats most nights, but [place] low at 2.5 on 04/03/22, so not menopausal. Decision to switch to the generic of LoEstrin 24 Fe 1/20.  Other orders - valACYclovir (VALTREX) 500 MG tablet; Take 500 mg by mouth 3 (three) times daily. - trifluridine (VIROPTIC) 1 % ophthalmic solution; SMARTSIG:1 Drop(s) In Eye(s) 6 Times Daily - buPROPion (WELLBUTRIN XL) 300 MG 24 hr tablet; Take 300 mg by mouth daily. - estradiol (ESTRACE VAGINAL) 0.1 MG/GM vaginal cream; Place 1 Applicatorful vaginally 2 (two) times a week. - Norethindrone Acetate-Ethinyl Estrad-FE (LOESTRIN 24 FE) 1-20 MG-MCG(24) tablet; Take 1 tablet by mouth daily.   Katie Bruins MD, 1:38 PM

## 2022-10-28 LAB — CYTOLOGY - PAP: Diagnosis: NEGATIVE

## 2022-10-30 ENCOUNTER — Other Ambulatory Visit: Payer: Self-pay | Admitting: *Deleted

## 2022-10-30 MED ORDER — TINIDAZOLE 500 MG PO TABS: ORAL_TABLET | ORAL | 0 refills | Status: DC

## 2022-11-28 ENCOUNTER — Other Ambulatory Visit: Payer: Self-pay | Admitting: Obstetrics & Gynecology

## 2022-12-01 NOTE — Telephone Encounter (Signed)
Last AEX 10/23/2022---recall in place for 2024. Last mammo 09/16/2022-neg birads 1  Rx sent on 10/23/2022 for 42.5g tube w/ 4 refills. Sig: 1 applicatorful twice weekly.  Last fill per pharmacy on 11/23/2022.  Avg applicator holds 4 grams, is the desired dose 8 grams a week? Please advise.   Requesting 90 days supply.

## 2022-12-04 NOTE — Telephone Encounter (Signed)
Pt read mychart msg on 12/02/22 and has not provided response. Pt to reach out if any concerns/questions. Will close encounter.

## 2023-01-11 ENCOUNTER — Other Ambulatory Visit: Payer: Self-pay | Admitting: Cardiology

## 2023-03-24 ENCOUNTER — Ambulatory Visit: Payer: 59 | Attending: Cardiology | Admitting: Cardiology

## 2023-03-24 ENCOUNTER — Encounter: Payer: Self-pay | Admitting: Cardiology

## 2023-03-24 VITALS — BP 136/86 | HR 85 | Ht 68.25 in | Wt 147.0 lb

## 2023-03-24 DIAGNOSIS — I480 Paroxysmal atrial fibrillation: Secondary | ICD-10-CM

## 2023-03-24 DIAGNOSIS — I4819 Other persistent atrial fibrillation: Secondary | ICD-10-CM | POA: Diagnosis not present

## 2023-03-24 MED ORDER — ASPIRIN 81 MG PO TBEC: 81.0000 mg | DELAYED_RELEASE_TABLET | Freq: Every day | ORAL | 3 refills | Status: AC

## 2023-03-24 NOTE — Patient Instructions (Signed)
Medication Instructions:  Your physician has recommended you make the following change in your medication:  1) STOP taking Eliquis 2) START taking Aspirin 81 mg daily  *If you need a refill on your cardiac medications before your next appointment, please call your pharmacy   Follow-Up: At [facility], you and your health needs are our priority.  As part of our continuing mission to provide you with exceptional heart care, we have created designated Provider Care Teams.  These Care Teams include your primary Cardiologist (physician) and Advanced Practice Providers (APPs -  Physician Assistants and Nurse Practitioners) who all work together to provide you with the care you need, when you need it.  Your next appointment:   1 year(s)  Provider:   You may see CAMERON T LAMBERT, MD or one of the following Advanced Practice Providers on your designated Care Team:   Renee Ursuy, PA-C Michael "Andy" Tillery, PA-C Suzann Riddle, NP    

## 2023-03-24 NOTE — Progress Notes (Deleted)
  Electrophysiology Office Follow up Visit Note:    Date:  03/24/2023   ID:  Katie Williams, DOB 01/28/1966, MRN [id number]  PCP:  Ozella Rocks, MD  [facility] Cardiologist:  None  [facility] Electrophysiologist:  Lanier Prude, MD    Interval History:    Katie Williams is a 57 y.o. female who presents for a follow up visit.   I last saw her September 23, 2022.  She is post ablations in February 2023 and redo in August 2023.  She was previously on Eliquis and Multaq.  After the last appointment she stopped her Multaq and we planned follow-up for today.       Past medical, surgical, social and family history were reviewed.  ROS:   Please see the history of present illness.    All other systems reviewed and are negative.  EKGs/Labs/Other Studies Reviewed:    The following studies were reviewed today:    EKG:  The ekg ordered today demonstrates ***   Physical Exam:    VS:  There were no vitals taken for this visit.    Wt Readings from Last 3 Encounters:  10/23/22 168 lb (76.2 kg)  09/23/22 183 lb (83 kg)  07/22/22 199 lb 3.2 oz (90.4 kg)     GEN: *** Well nourished, well developed in no acute distress CARDIAC: ***RRR, no murmurs, rubs, gallops RESPIRATORY:  Clear to auscultation without rales, wheezing or rhonchi       ASSESSMENT:    No diagnosis found. PLAN:    In order of problems listed above:   #Paroxysmal atrial fibrillation and flutter Doing well after her catheter ablations in February 2023 in August 2023.  Aspirin***  #OSA on CPAP CPAP use encouraged  Follow-up 1 year with the EP APP.      Signed, Steffanie Dunn, MD, [facility], [facility] 03/24/2023 5:19 AM    Electrophysiology [facility]

## 2023-03-24 NOTE — Progress Notes (Signed)
Electrophysiology Office Follow up Visit Note:    Date:  03/24/2023   ID:  Katie Williams, DOB [birth year], MRN [id number]  PCP:  Ozella Rocks, MD  [facility] Cardiologist:  None  [facility] Electrophysiologist:  Lanier Prude, MD    Interval History:    Katie Williams is a 57 y.o. female who presents for a follow up visit.   I last saw her September 23, 2022.  She is post ablations in February 2023 and redo in August 2023.  She was previously on Eliquis and Multaq.  After the last appointment she stopped her Multaq and we planned follow-up for today.  Today, she states that she is feeling well and has lost over 50 lbs. She has been following a clean diet with no alcohol consumption. For exercise she is walking a minimum of 3 miles daily, with working up to 6 miles. Also working on [employer] training.  She denies any recurring episodes of atrial fibrillation or any indication of arrhythmia. She does have a smart watch to monitor her heart rhythm. In the past she has always been able to tell when she is in Afib.  She denies any palpitations, chest pain, shortness of breath, or peripheral edema. No lightheadedness, headaches, syncope, orthopnea, or PND.     Past medical, surgical, social and family history were reviewed.  ROS:   Please see the history of present illness.    All other systems reviewed and are negative.  EKGs/Labs/Other Studies Reviewed:    The following studies were reviewed today:   EKG:  The ekg ordered today demonstrates sinus rhythm   Physical Exam:    VS:  BP 136/86   Pulse 85   Ht 5' 8.25" (1.734 m)   Wt 147 lb (66.7 kg)   SpO2 98%   BMI 22.19 kg/m     Wt Readings from Last 3 Encounters:  03/24/23 147 lb (66.7 kg)  10/23/22 168 lb (76.2 kg)  09/23/22 183 lb (83 kg)     GEN:  Well nourished, well developed in no acute distress CARDIAC: RRR, no murmurs, rubs, gallops RESPIRATORY:  Clear to auscultation without rales,  wheezing or rhonchi       ASSESSMENT:    1. Persistent atrial fibrillation (HCC)   2. Paroxysmal atrial fibrillation (HCC)    PLAN:    In order of problems listed above:   #Paroxysmal atrial fibrillation and flutter Doing well after her catheter ablations in February 2023 and August 2023. On eliquis but prefers to avoid long term exposure if at all possible. I discussed the stroke risk associated with her hx of atrial fibrillation. We discussed the limited data supporting stopping her OAC after a seemingly successful ablation. Her chadsvasc is 2 for gender and HTN. After our discussion about the pros/cons of stopping OAC, she has decided to stop the [facility] and start Aspirin 81mg  PO daily. She will continue monitoring her heart rhythm using her smart watch.   #OSA on CPAP CPAP use encouraged  #Hypertension At goal today.  Recommend checking blood pressures 1-2 times per week at home and recording the values.  Recommend bringing these recordings to the primary care physician.  Follow-up 1 year with the EP APP.  I,Mathew Stumpf,acting as a Neurosurgeon for Lanier Prude, MD.,have documented all relevant documentation on the behalf of Lanier Prude, MD,as directed by  Lanier Prude, MD while in the presence of Lanier Prude, MD.  I, Lanier Prude,  MD, have reviewed all documentation for this visit. The documentation on 03/24/23 for the exam, diagnosis, procedures, and orders are all accurate and complete.   Signed, Steffanie Dunn, MD, [facility], [facility] 03/24/2023 9:55 PM    Electrophysiology [facility]

## 2023-10-06 ENCOUNTER — Other Ambulatory Visit: Payer: Self-pay | Admitting: Obstetrics & Gynecology

## 2023-10-06 ENCOUNTER — Other Ambulatory Visit: Payer: Self-pay | Admitting: Family Medicine

## 2023-10-06 DIAGNOSIS — Z1231 Encounter for screening mammogram for malignant neoplasm of breast: Secondary | ICD-10-CM

## 2023-10-07 ENCOUNTER — Ambulatory Visit
Admission: RE | Admit: 2023-10-07 | Discharge: 2023-10-07 | Disposition: A | Discharge status: Home / Self Care | Payer: 59 | Source: Ambulatory Visit

## 2023-10-07 DIAGNOSIS — Z1231 Encounter for screening mammogram for malignant neoplasm of breast: Secondary | ICD-10-CM

## 2023-10-29 ENCOUNTER — Ambulatory Visit (INDEPENDENT_AMBULATORY_CARE_PROVIDER_SITE_OTHER): Payer: 59 | Admitting: Obstetrics and Gynecology

## 2023-10-29 ENCOUNTER — Encounter: Payer: Self-pay | Admitting: Obstetrics and Gynecology

## 2023-10-29 VITALS — BP 118/72 | HR 88 | Ht 68.5 in | Wt 154.0 lb

## 2023-10-29 DIAGNOSIS — N898 Other specified noninflammatory disorders of vagina: Secondary | ICD-10-CM

## 2023-10-29 DIAGNOSIS — N951 Menopausal and female climacteric states: Secondary | ICD-10-CM

## 2023-10-29 DIAGNOSIS — Z01419 Encounter for gynecological examination (general) (routine) without abnormal findings: Secondary | ICD-10-CM | POA: Insufficient documentation

## 2023-10-29 DIAGNOSIS — N952 Postmenopausal atrophic vaginitis: Secondary | ICD-10-CM | POA: Diagnosis not present

## 2023-10-29 DIAGNOSIS — Z79890 Hormone replacement therapy (postmenopausal): Secondary | ICD-10-CM | POA: Diagnosis not present

## 2023-10-29 MED ORDER — ESTRADIOL 0.1 MG/GM VA CREA: TOPICAL_CREAM | VAGINAL | 1 refills | Status: DC

## 2023-10-29 NOTE — Assessment & Plan Note (Signed)
 Cervical cancer screening performed according to ASCCP guidelines. Encouraged annual mammogram screening Colonoscopy UTD DXA N/A Labs and immunizations with her primary Encouraged safe sexual practices as indicated Encouraged healthy lifestyle practices with diet and exercise For patients under 50-55yo, I recommend 1200mg  calcium daily and 600IU of vitamin D daily.

## 2023-10-29 NOTE — Patient Instructions (Signed)
For patients under 50-55yo, I recommend 1200mg  calcium daily and 600IU of vitamin D daily. For patients over 55yo, I recommend 1200mg  calcium daily and 800IU of vitamin D daily.  Health Maintenance, Female Adopting a healthy lifestyle and getting preventive care are important in promoting health and wellness. Ask your health care provider about: The right schedule for you to have regular tests and exams. Things you can do on your own to prevent diseases and keep yourself healthy. What should I know about diet, weight, and exercise? Eat a healthy diet  Eat a diet that includes plenty of vegetables, fruits, low-fat dairy products, and lean protein. Do not eat a lot of foods that are high in solid fats, added sugars, or sodium. Maintain a healthy weight Body mass index (BMI) is used to identify weight problems. It estimates body fat based on height and weight. Your health care provider can help determine your BMI and help you achieve or maintain a healthy weight. Get regular exercise Get regular exercise. This is one of the most important things you can do for your health. Most adults should: Exercise for at least 150 minutes each week. The exercise should increase your heart rate and make you sweat (moderate-intensity exercise). Do strengthening exercises at least twice a week. This is in addition to the moderate-intensity exercise. Spend less time sitting. Even light physical activity can be beneficial. Watch cholesterol and blood lipids Have your blood tested for lipids and cholesterol at 55 years of age, then have this test every 5 years. Have your cholesterol levels checked more often if: Your lipid or cholesterol levels are high. You are older than 55 years of age. You are at high risk for heart disease. What should I know about cancer screening? Depending on your health history and family history, you may need to have cancer screening at various ages. This may include screening  for: Breast cancer. Cervical cancer. Colorectal cancer. Skin cancer. Lung cancer. What should I know about heart disease, diabetes, and high blood pressure? Blood pressure and heart disease High blood pressure causes heart disease and increases the risk of stroke. This is more likely to develop in people who have high blood pressure readings or are overweight. Have your blood pressure checked: Every 3-5 years if you are 83-55 years of age. Every year if you are 4 years old or older. Diabetes Have regular diabetes screenings. This checks your fasting blood sugar level. Have the screening done: Once every three years after age 68 if you are at a normal weight and have a low risk for diabetes. More often and at a younger age if you are overweight or have a high risk for diabetes. What should I know about preventing infection? Hepatitis B If you have a higher risk for hepatitis B, you should be screened for this virus. Talk with your health care provider to find out if you are at risk for hepatitis B infection. Hepatitis C Testing is recommended for: Everyone born from [birth year] through 1963. Anyone with known risk factors for hepatitis C. Sexually transmitted infections (STIs) Get screened for STIs, including gonorrhea and chlamydia, if: You are sexually active and are younger than 55 years of age. You are older than 55 years of age and your health care provider tells you that you are at risk for this type of infection. Your sexual activity has changed since you were last screened, and you are at increased risk for chlamydia or gonorrhea. Ask your health care provider if  you are at risk. Ask your health care provider about whether you are at high risk for HIV. Your health care provider may recommend a prescription medicine to help prevent HIV infection. If you choose to take medicine to prevent HIV, you should first get tested for HIV. You should then be tested every 3 months for as long as you  are taking the medicine. Osteoporosis and menopause Osteoporosis is a disease in which the bones lose minerals and strength with aging. This can result in bone fractures. If you are 44 years old or older, or if you are at risk for osteoporosis and fractures, ask your health care provider if you should: Be screened for bone loss. Take a calcium or vitamin D supplement to lower your risk of fractures. Be given hormone replacement therapy (HRT) to treat symptoms of menopause. Follow these instructions at home: Alcohol use Do not drink alcohol if: Your health care provider tells you not to drink. You are pregnant, may be pregnant, or are planning to become pregnant. If you drink alcohol: Limit how much you have to: 0-1 drink a day. Know how much alcohol is in your drink. In the [place] one drink equals one 12 oz bottle of beer (355 mL), one 5 oz glass of wine (148 mL), or one 1 oz glass of hard liquor (44 mL). Lifestyle Do not use any products that contain nicotine or tobacco. These products include cigarettes, chewing tobacco, and vaping devices, such as e-cigarettes. If you need help quitting, ask your health care provider. Do not use street drugs. Do not share needles. Ask your health care provider for help if you need support or information about quitting drugs. General instructions Schedule regular health, dental, and eye exams. Stay current with your vaccines. Tell your health care provider if: You often feel depressed. You have ever been abused or do not feel safe at home. Summary Adopting a healthy lifestyle and getting preventive care are important in promoting health and wellness. Follow your health care provider's instructions about healthy diet, exercising, and getting tested or screened for diseases. Follow your health care provider's instructions on monitoring your cholesterol and blood pressure. This information is not intended to replace advice given to you by your health  care provider. Make sure you discuss any questions you have with your health care provider. Document Revised: 03/18/2021 Document Reviewed: 03/18/2021 Elsevier Patient Education  2024 [employer].

## 2023-10-29 NOTE — Progress Notes (Addendum)
[name] postmenopausal female with posterior uterine fibroid (2023 [place], 8cm), endometrial ablation (2023) here for annual exam. Married.  Works for [employer], Journalist, newspaper.   Pt states she has experience a different vaginal flora and discharge lately. Stopped COC 1 month ago for testing today. No bleeding over past year. +VMS.  Abnormal bleeding: none Pelvic discharge or pain: none Breast mass, nipple discharge or skin changes : none Birth control: COC Last PAP:     Component Value Date/Time   DIAGPAP  10/23/2022 1356    - Negative for intraepithelial lesion or malignancy (NILM)   ADEQPAP  10/23/2022 1356    Satisfactory for evaluation; transformation zone component PRESENT.   Last mammogram: 10/07/2023 BIRADS 1, density b Last colonoscopy: 01/28/2019  Sexually active: yes  Exercising: yes, walk Smoker: no  GYN HISTORY: Endometrial ablation 2023  OB History  Gravida Para Term Preterm AB Living  0 0 0 0 0 0  SAB IAB Ectopic Multiple Live Births  0 0 0 0 0    Past Medical History:  Diagnosis Date   Anticoagulated    eliquis--- managed by cardiology   Anxiety and depression    GERD (gastroesophageal reflux disease)    History of adenomatous polyp of colon    History of COVID-19 07/2020   per pt severe symptoms but breathing issues, recovered at home, no hospitzations   Hypertension    Insomnia    Low vitamin D level    OSA on CPAP    PAF (paroxysmal atrial fibrillation) (HCC) 02/2021   cardiologist--- dr c. Lalla Brothers;    s/p EP ablation afib 12-26-2021 residual post tachypalpitations   Pulmonary nodule    incidental finding on cardiac CT 12-19-2021   Tachycardia     Past Surgical History:  Procedure Laterality Date   ATRIAL FIBRILLATION ABLATION N/A 12/26/2021   Procedure: ATRIAL FIBRILLATION ABLATION;  Surgeon: Lanier Prude, MD;  Location: [facility];  Service: Cardiovascular;  Laterality: N/A;   ATRIAL FIBRILLATION ABLATION N/A  06/24/2022   Procedure: ATRIAL FIBRILLATION ABLATION;  Surgeon: Lanier Prude, MD;  Location: [facility];  Service: Cardiovascular;  Laterality: N/A;   CATARACT EXTRACTION W/ INTRAOCULAR LENS IMPLANT Bilateral 01/2020   COLONOSCOPY WITH PROPOFOL  01/28/2019   by dr stark   DILATATION & CURETTAGE/HYSTEROSCOPY WITH MYOSURE N/A 05/06/2022   Procedure: DILATATION & CURETTAGE/HYSTEROSCOPY WITH MYOSURE;  Surgeon: Genia Del, MD;  Location: [facility] [place];  Service: Gynecology;  Laterality: N/A;   DILITATION & CURRETTAGE/HYSTROSCOPY WITH NOVASURE ABLATION N/A 05/06/2022   Procedure: DILATATION & CURETTAGE/HYSTEROSCOPY WITH NOVASURE ABLATION;  Surgeon: Genia Del, MD;  Location: [facility] [place];  Service: Gynecology;  Laterality: N/A;   ESOPHAGOGASTRODUODENOSCOPY  06/18/2021   by dr stark    Current Outpatient Medications on File Prior to Visit  Medication Sig Dispense Refill   aspirin EC 81 MG tablet Take 1 tablet (81 mg total) by mouth daily. Swallow whole. 90 tablet 3   buPROPion (WELLBUTRIN XL) 300 MG 24 hr tablet Take 300 mg by mouth daily.     hydrochlorothiazide (HYDRODIURIL) 25 MG tablet Take 25 mg by mouth in the morning.     No current facility-administered medications on file prior to visit.    Social History   Socioeconomic History   Marital status: Married    Spouse name: Not on file   Number of children: Not on file   Years of education: Not on file   Highest  education level: Not on file  Occupational History   Not on file  Tobacco Use   Smoking status: Never   Smokeless tobacco: Never   Tobacco comments:    Never smoke 07/22/22  Vaping Use   Vaping status: Never Used  Substance and Sexual Activity   Alcohol use: Yes    Comment: occ   Drug use: No   Sexual activity: Yes    Partners: Male    Birth control/protection: Pill    Comment: 1st intercourse- 20, micronor, ablation  Other Topics Concern   Not on file   Social History Narrative   Not on file   Social Drivers of Health   Financial Resource Strain: Not on file  Food Insecurity: Not on file  Transportation Needs: Not on file  Physical Activity: Not on file  Stress: Not on file  Social Connections: Not on file  Intimate Partner Violence: Not on file    Family History  Problem Relation Age of Onset   Hypertension Mother    Colon cancer Father 82   Hypertension Father    Colon polyps Brother 63       precancerous polyps   Colon cancer Paternal Grandmother 31   Colon cancer Paternal Grandfather 40    No Known Allergies    PE Today's Vitals   10/29/23 1000  BP: 118/72  Pulse: 88  SpO2: 99%  Weight: 154 lb (69.9 kg)  Height: 5' 8.5" (1.74 m)   Body mass index is 23.07 kg/m.  Physical Exam Vitals reviewed. Exam conducted with a chaperone present.  Constitutional:      General: She is not in acute distress.    Appearance: Normal appearance.  HENT:     Head: Normocephalic and atraumatic.     Nose: Nose normal.  Eyes:     Extraocular Movements: Extraocular movements intact.     Conjunctiva/sclera: Conjunctivae normal.  Neck:     Thyroid: No thyroid mass, thyromegaly or thyroid tenderness.  Pulmonary:     Effort: Pulmonary effort is normal.  Chest:     Chest wall: No mass or tenderness.  Breasts:    Right: Normal. No swelling, mass, nipple discharge, skin change or tenderness.     Left: Normal. No swelling, mass, nipple discharge, skin change or tenderness.  Abdominal:     General: There is no distension.     Palpations: Abdomen is soft.     Tenderness: There is no abdominal tenderness.  Genitourinary:    General: Normal vulva.     Exam position: Lithotomy position.     Urethra: No prolapse.     Vagina: Normal. No vaginal discharge or bleeding.     Cervix: Normal. No lesion.     Uterus: Normal. Not enlarged and not tender.      Adnexa: Right adnexa normal and left adnexa normal.  Musculoskeletal:         General: Normal range of motion.     Cervical back: Normal range of motion.  Lymphadenopathy:     Upper Body:     Right upper body: No axillary adenopathy.     Left upper body: No axillary adenopathy.     Lower Body: No right inguinal adenopathy. No left inguinal adenopathy.  Skin:    General: Skin is warm and dry.  Neurological:     General: No focal deficit present.     Mental Status: She is alert.  Psychiatric:        Mood and Affect: Mood normal.  Behavior: Behavior normal.      Assessment and Plan:        Well woman exam with routine gynecological exam Assessment & Plan: Cervical cancer screening performed according to ASCCP guidelines. Encouraged annual mammogram screening Colonoscopy UTD DXA N/A Labs and immunizations with her primary Encouraged safe sexual practices as indicated Encouraged healthy lifestyle practices with diet and exercise For patients under 50-70yo, I recommend 1200mg  calcium daily and 600IU of vitamin D daily.    Perimenopause -     Follicle stimulating hormone  Vaginal atrophy -     Estradiol; Apply 1g to vulva twice a week.  Dispense: 42 g; Refill: 1  Vaginal discharge -     WET PREP FOR TRICH, YEAST, CLUE  Hormone replacement therapy, postmenopausal -     Progesterone; Take 1 capsule (100 mg total) by mouth daily.  Dispense: 90 capsule; Refill: 3 -     Estradiol; Place 1 patch (0.05 mg total) onto the skin 2 (two) times a week.  Dispense: 24 patch; Refill: 3  Vasomotor symptoms due to menopause -     Progesterone; Take 1 capsule (100 mg total) by mouth daily.  Dispense: 90 capsule; Refill: 3 -     Estradiol; Place 1 patch (0.05 mg total) onto the skin 2 (two) times a week.  Dispense: 24 patch; Refill: 3   Discussed low dose estrogen COC of FSH low vs HRT with TD estrogen if FSH high. Addended to reflect updated orders as a result of elevated FSH. Patient started on hormone replacement therapy with estradiol patch and oral  progesterone.  Katie Gess, MD

## 2023-10-30 DIAGNOSIS — N951 Menopausal and female climacteric states: Secondary | ICD-10-CM | POA: Insufficient documentation

## 2023-10-30 DIAGNOSIS — Z79890 Hormone replacement therapy (postmenopausal): Secondary | ICD-10-CM | POA: Insufficient documentation

## 2023-10-30 LAB — FOLLICLE STIMULATING HORMONE: FSH: 128.1 m[IU]/mL — ABNORMAL HIGH

## 2023-10-30 MED ORDER — ESTRADIOL 0.05 MG/24HR TD PTTW: 1.0000 | MEDICATED_PATCH | TRANSDERMAL | 3 refills | Status: AC

## 2023-10-30 MED ORDER — PROGESTERONE MICRONIZED 100 MG PO CAPS: 100.0000 mg | ORAL_CAPSULE | Freq: Every day | ORAL | 3 refills | Status: DC

## 2023-10-30 NOTE — Addendum Note (Signed)
Addended by: Rosalyn Gess on: 10/30/2023 05:55 PM   Modules accepted: Orders

## 2023-11-19 LAB — WET PREP FOR TRICH, YEAST, CLUE

## 2024-04-27 ENCOUNTER — Other Ambulatory Visit: Payer: Self-pay | Admitting: Obstetrics and Gynecology

## 2024-04-27 DIAGNOSIS — N952 Postmenopausal atrophic vaginitis: Secondary | ICD-10-CM

## 2024-04-28 NOTE — Telephone Encounter (Signed)
 Medication refill request: estrace  0.1mg  Last AEX:  10-29-23 Next AEX: not scheduled Last MMG (if hormonal medication request): 10-07-23 birads 1:neg Refill authorized: please approve if appropriate

## 2024-10-31 ENCOUNTER — Other Ambulatory Visit: Payer: Self-pay | Admitting: Obstetrics and Gynecology

## 2024-10-31 DIAGNOSIS — Z79890 Hormone replacement therapy (postmenopausal): Secondary | ICD-10-CM

## 2024-10-31 DIAGNOSIS — N952 Postmenopausal atrophic vaginitis: Secondary | ICD-10-CM

## 2024-10-31 DIAGNOSIS — N951 Menopausal and female climacteric states: Secondary | ICD-10-CM

## 2024-10-31 NOTE — Telephone Encounter (Signed)
 Med refill request: estradiol  and progesterone  Last AEX: 10/29/23 Next AEX: not scheduled, sent message to front desk to schedule annual visit Last MMG (if hormonal med) 10/07/23 Refill authorized: Last RX sent - estradiol  cream 04/28/24 #42.5 g with 1 refill; progesterone  10/30/23 #90 with 3 refills. Please Advise?

## 2024-10-31 NOTE — Telephone Encounter (Signed)
 Needs annual for any additional refills
